# Patient Record
Sex: Female | Born: 1970 | Race: White | Hispanic: No | Marital: Married | Smoking: Never smoker
Health system: Southern US, Community
[De-identification: ages and names within clinical notes are randomized; demographics above are authoritative.]

## PROBLEM LIST (undated history)

## (undated) DIAGNOSIS — Z01419 Encounter for gynecological examination (general) (routine) without abnormal findings: Secondary | ICD-10-CM

## (undated) DIAGNOSIS — Z87440 Personal history of urinary (tract) infections: Secondary | ICD-10-CM

## (undated) DIAGNOSIS — F419 Anxiety disorder, unspecified: Secondary | ICD-10-CM

## (undated) DIAGNOSIS — Z8619 Personal history of other infectious and parasitic diseases: Secondary | ICD-10-CM

## (undated) HISTORY — DX: Personal history of urinary (tract) infections: Z87.440

## (undated) HISTORY — DX: Personal history of other infectious and parasitic diseases: Z86.19

## (undated) HISTORY — DX: Encounter for gynecological examination (general) (routine) without abnormal findings: Z01.419

## (undated) HISTORY — DX: Anxiety disorder, unspecified: F41.9

---

## 1998-01-24 ENCOUNTER — Other Ambulatory Visit: Admission: RE | Admit: 1998-01-24 | Discharge: 1998-01-24 | Payer: Self-pay | Admitting: Obstetrics and Gynecology

## 2003-02-21 ENCOUNTER — Emergency Department (HOSPITAL_COMMUNITY): Admission: EM | Admit: 2003-02-21 | Discharge: 2003-02-21 | Payer: Self-pay | Admitting: Emergency Medicine

## 2003-05-19 ENCOUNTER — Other Ambulatory Visit: Admission: RE | Admit: 2003-05-19 | Discharge: 2003-05-19 | Payer: Self-pay | Admitting: Obstetrics and Gynecology

## 2003-11-17 ENCOUNTER — Inpatient Hospital Stay (HOSPITAL_COMMUNITY): Admission: AD | Admit: 2003-11-17 | Discharge: 2003-11-22 | Payer: Self-pay | Admitting: Obstetrics and Gynecology

## 2003-12-26 ENCOUNTER — Other Ambulatory Visit: Admission: RE | Admit: 2003-12-26 | Discharge: 2003-12-26 | Payer: Self-pay | Admitting: Obstetrics and Gynecology

## 2005-02-07 ENCOUNTER — Other Ambulatory Visit: Admission: RE | Admit: 2005-02-07 | Discharge: 2005-02-07 | Payer: Self-pay | Admitting: Obstetrics and Gynecology

## 2005-09-30 ENCOUNTER — Inpatient Hospital Stay (HOSPITAL_COMMUNITY): Admission: RE | Admit: 2005-09-30 | Discharge: 2005-10-03 | Payer: Self-pay | Admitting: Obstetrics and Gynecology

## 2005-12-12 ENCOUNTER — Other Ambulatory Visit: Admission: RE | Admit: 2005-12-12 | Discharge: 2005-12-12 | Payer: Self-pay | Admitting: Obstetrics and Gynecology

## 2008-02-07 ENCOUNTER — Encounter: Admission: RE | Admit: 2008-02-07 | Discharge: 2008-02-07 | Payer: Self-pay | Admitting: Obstetrics and Gynecology

## 2011-01-17 NOTE — Discharge Summary (Signed)
Chan, Teresa                         ACCOUNT NO.:  [id number]   MEDICAL RECORD NO.:  [id number]                   PATIENT TYPE:  INP   LOCATION:  9110                                 FACILITY:  [facility]   PHYSICIAN:  Michelle L. Vincente Poli, M.D.            DATE OF BIRTH:  04/22/1971   DATE OF ADMISSION:  11/17/2003  DATE OF DISCHARGE:  11/22/2003                                 DISCHARGE SUMMARY   ADMITTING DIAGNOSES:  1. Intrauterine pregnancy at 78 weeks estimated gestational age.  2. Induction of labor secondary to macrosomia.   DISCHARGE DIAGNOSES:  1. Status post low transverse cesarean section secondary to failed induction     and macrosomia.  2. Viable female infant.   PROCEDURE:  Primary low transverse cesarean section.   REASON FOR ADMISSION:  Please see written H&P.   HOSPITAL COURSE:  The patient was a 40 year old primigravida that was  admitted to [facility] at 39 weeks estimated gestational  age for induction of labor due to large-for-gestational-age infant by  ultrasound.  The patient underwent Cytotec cervical ripening with no  noticeable effect.  She underwent one full day of Pitocin induction with  regular strong contractions but no cervical change at the end of the day.  She underwent a second course of cervical ripening with Cytotec, again with  no gain or appreciable effect and the second day of Pitocin achieving  maximal Pitocin but never truly achieving a pattern of labor.  With a  persistently-closed cervix the patient opted for a primary low transverse  cesarean section for a failed induction.  The patient was then transferred  to the operating room where spinal anesthesia was administered without  difficulty.  A low transverse incision was made with the delivery of a  viable female infant weighing 9 pounds 8 ounces with Apgars of 9 at one minute  and 9 at five minutes.  Umbilical cord pH was 7.30.  The patient tolerated  the procedure well  and was taken to the recovery room in stable condition.  On postoperative day #1 vital signs were stable; she was afebrile.  Abdominal was soft with good return of bowel function.  Fundus was firm and  nontender.  Abdominal dressing was clean, dry, and intact.  Labs revealed  hemoglobin of 9.1.  On postoperative day #2 the patient was without  complaint.  Vital signs were stable; she was afebrile.  Abdomen was soft,  fundus was firm and nontender.  Incision was clean, dry, and intact.  Labs  revealed hemoglobin 9.1; platelet count 228,000; wbc count of 10.5.  On  postoperative day #3 vital signs were stable.  The patient was without  complaint.  Abdomen was soft, fundus was firm and nontender.  Incision was  clean, dry, and intact.  Staples were removed and the patient was discharged  home.   CONDITION ON DISCHARGE:  Good.   DIET:  Regular as  tolerated..   ACTIVITY:  No heavy lifting, no driving x2 weeks, no vaginal entry.   FOLLOW-UP:  The patient was to follow up in the office in 1 week for an  incision check.  She is to call for temperature greater than 100 degrees,  persistent nausea and vomiting, heavy vaginal bleeding, and/or redness or  drainage from the incisional site.   DISCHARGE MEDICATIONS:  1. Tylox  #30 one p.o. q.4-6h. p.r.n.  2. Ibuprofen 600 mg q.6h.  3. Prenatal vitamins one p.o. daily.  4. Colace one p.o. daily p.r.n.     [name], N.P.                        [name], M.D.    CC/MEDQ  D:  12/11/2003  T:  12/11/2003  Job:  [id number]

## 2011-01-17 NOTE — H&P (Signed)
NAMEKEARSTON, PUTMAN             ACCOUNT NO.:  [id number]   MEDICAL RECORD NO.:  [id number]          PATIENT TYPE:  INP   LOCATION:  NA                            [facility]  [facility]   PHYSICIAN:  Dineen Kid. Rana Snare, M.D.    DATE OF BIRTH:  February 01, 1971   DATE OF ADMISSION:  DATE OF DISCHARGE:                                HISTORY & PHYSICAL   HISTORY OF PRESENT ILLNESS:  Ms. Demauro is a 40 year old G3, P1, A1 at [redacted]  weeks gestational age who presents for repeat Cesarean section.  Her primary  Cesarean section was in 2005 for failed induction and macrosomia.  This  pregnancy has been unremarkable.  She does desire repeat Cesarean section.  Her estimated date of confinement is October 07, 2005.  She is group B Strep  negative.  She had normal ultrasound evaluation and quad screen.   PAST MEDICAL HISTORY:  Unremarkable.  She has had a colposcopy and Cesarean  section.   PHYSICAL EXAMINATION:  VITAL SIGNS:  Blood pressure is 108/70.  HEART:  Regular rate and rhythm.  LUNGS:  Clear to auscultation bilaterally.  ABDOMEN:  Gravid, nontender.  PELVIC:  Cervix is closed, thick and high.   IMPRESSION:  Intrauterine pregnancy at 39 weeks with previous Cesarean  section desiring repeat.   PLAN:  Repeat low transverse Cesarean section.  The risks and benefits were  discussed at length and informed consent was obtained.      Dineen Kid Rana Snare, M.D.  Electronically Signed     DCL/MEDQ  D:  09/30/2005  T:  09/30/2005  Job:  [id number]

## 2011-01-17 NOTE — Discharge Summary (Signed)
NAMEZULEIKA, Teresa Chan             ACCOUNT NO.:  [id number]   MEDICAL RECORD NO.:  [id number]          PATIENT TYPE:  INP   LOCATION:  9146                          [facility]   PHYSICIAN:  Zelphia Cairo, MD    DATE OF BIRTH:  [birth year]   DATE OF ADMISSION:  09/30/2005  DATE OF DISCHARGE:  10/03/2005                                 DISCHARGE SUMMARY   ADMISSION DIAGNOSES:  1.  Intrauterine pregnancy at 52 weeks' estimated gestational age.  2.  Previous cesarean section, desires repeat.   DISCHARGE DIAGNOSES:  1.  Status post low transverse cesarean section.  2.  A viable female infant.   PROCEDURE:  Repeat low transverse cesarean section.   REASON FOR ADMISSION:  Please see written H&P.   HOSPITAL COURSE:  The patient is a 40 year old gravida 2, para 1, that was  admitted to [facility] at 70 weeks' estimated gestational  age for a scheduled cesarean section.  The patient had a history of  macrosomia and had a previous cesarean delivery.  The patient desired  cesarean delivery.  On the morning of admission the patient was taken to the  operating room, where spinal anesthesia was administered without difficulty.  A low transverse incision was made with delivery of a viable female infant  weighing 8 pounds 11 ounces, Apgars of 9 at one minute and 9 at five  minutes.  The patient tolerated the procedure well and was taken to the  recovery room in stable condition.  On postoperative day #1, the patient was  without complaint.  Vital signs were stable.  She was afebrile.  Abdomen  soft.  Fundus firm and nontender.  Abdominal dressing was noted to be clean,  dry and intact.  Laboratory findings did reveal low hemoglobin of 7.4.  On  postoperative day #2, the patient was without complaint.  Vital signs were  stable.  She was afebrile.  Abdomen was soft with good return of bowel function.  Fundus firm and nontender.  Incision was clean, dry and intact.  The patient  was started on iron supplementation.  On postoperative day #3, the patient  was without complaint.  Vital signs were stable.  Fundus firm and nontender.  Incision was clean, dry and intact with some mild ecchymosis noted around  the incisional site.  Discharge instructions reviewed and the patient was  later discharged home.   CONDITION ON DISCHARGE:  Good.   DIET:  Regular as tolerated.   ACTIVITY:  No heavy lifting, no driving x2 weeks, no vaginal entry.   FOLLOWUP:  Patient to follow up in the office in one week for an incision  check.  She is to call for temperature greater than 100 degrees, persistent  nausea or vomiting, heavy vaginal bleeding and/or redness or drainage from  the incisional site.   DISCHARGE MEDICATIONS:  1.  Tylox #30, one p.o. q.4-6h. p.r.n.  2.  Motrin 600 mg every six hours.  3.  Prenatal vitamins one p.o. daily.  4.  Ferrous sulfate 325 mg one p.o. daily.      [name], N.P.  Zelphia Cairo, MD  Electronically Signed    CC/MEDQ  D:  10/17/2005  T:  10/17/2005  Job:  [id number]

## 2011-01-17 NOTE — Op Note (Signed)
Teresa Chan, Teresa Chan                         ACCOUNT NO.:  [id number]   MEDICAL RECORD NO.:  [id number]                   PATIENT TYPE:  INP   LOCATION:  9110                                 [facility]ACILITY[facility]   PHYSICIAN:  Dineen Kid. Rana Snare, M.D.                 DATE OF BIRTH:  [birth year]   DATE OF PROCEDURE:  11/19/2003  DATE OF DISCHARGE:                                 OPERATIVE REPORT   PREOPERATIVE DIAGNOSIS:  Intrauterine pregnancy at 39 weeks, macrosomia, and  failed induction.   POSTOPERATIVE DIAGNOSIS:  Intrauterine pregnancy at 39 weeks, macrosomia,  and failed induction.   PROCEDURE:  Primary low segment transverse cesarean section.   SURGEON:  Dineen Kid. Rana Snare, M.D.   ANESTHESIA:  Spinal.   INDICATIONS FOR PROCEDURE:  Ms. Durkin is a 40 year old G1 who presented  at 76 weeks for induction of labor due to large for gestational age infant  by ultrasound.  She underwent Cytotec cervical ripening with no noticeable  affect.  She underwent one full day of Pitocin induction with regular strong  contractions but no cervical change at the end of the day.  She underwent a  second course of cervical ripening with Cytotec with, again, no appreciable  affect and a second day of Pitocin achieving maximum Pitocin but never truly  achieving labor.  With a persistently closed cervix, the patient then opted  for primary low segment transverse cesarean section for failed induction.  The risks and benefits were discussed, informed consent was obtained.   OPERATIVE FINDINGS:  A viable female infant, Apgars 9 and 9, weight 9 pounds 8  ounces, pH arterial 7.3.   DESCRIPTION OF PROCEDURE:  After adequate analgesia, the patient was placed  in the supine position left lateral tilt.  She was sterilely prepped and  draped.  The Foley catheter was sterilely placed.  A Pfannenstiel skin  incision was made 2 fingerbreadths above the pubic symphysis taking it  sharply to the fascia.  This was incised  transversely, extended superiorly  and inferiorly to the rectus muscle which was separated sharply in the  midline.  The peritoneum was entered sharply, the bladder blade was created  and placed behind the bladder blade.  A low segment myotomy incision was  made down to the infant's vertex, extended laterally with the operators  fingertips.  The infant's vertex was then delivered with the vacuum  extractor.  The nares were suctioned.  The infant's shoulders were then  delivered and the remainder of the fetus.  The cord was clamped, cut, and  the infant was handed to the pediatrician for resuscitation.  Good cry was  noted.  The placenta was extracted manually.  The uterus was exteriorized,  wiped clean of a dry lap, and the myotomy incision was closed in two layers,  the first being running locking layer, the second being an imbricated layer  of 0 Monocryl suture.  Irrigation was applied.  The uterus was placed back  into the peritoneal cavity.  After a copious amount of irrigation, adequate  hemostasis was assured.  The peritoneum was closed with 0 Monocryl in a  running fashion, the rectus muscles were plicated.  Irrigation was applied  and after adequate hemostasis, the fascia was closed with a #1 Vicryl in a  running fashion.  Irrigation was  once again applied and after hemostasis, the skin was stapled and Steri-  Strips were applied.  The patient tolerated the procedure well, was stable  on transfer to the recovery room.  Sponge and instrument counts were correct  x 3.  Estimated blood loss was 700 mL.  The patient received 1 gram of  Cefotetan after delivery of the placenta.                                               Dineen Kid Rana Snare, M.D.    DCL/MEDQ  D:  11/19/2003  T:  11/20/2003  Job:  [id number]

## 2011-01-17 NOTE — Op Note (Signed)
Teresa Chan, Teresa Chan             ACCOUNT NO.:  [id number]   MEDICAL RECORD NO.:  [id number]          PATIENT TYPE:  INP   LOCATION:  9146                          [facility]   PHYSICIAN:  Dineen Kid. Rana Snare, M.D.    DATE OF BIRTH:  06-Dec-1970   DATE OF PROCEDURE:  09/30/2005  DATE OF DISCHARGE:                                 OPERATIVE REPORT   PREOPERATIVE DIAGNOSES:  1.  Previous cesarean section with desired repeat.  2.  Intrauterine pregnancy at 39 weeks.   POSTOPERATIVE DIAGNOSES:  1.  Previous cesarean section with desired repeat.  2.  Intrauterine pregnancy at 39 weeks.   PROCEDURE:  Repeat low segment transverse cesarean section.   SURGEON:  Dineen Kid. Rana Snare, M.D.   ANESTHESIA:  Spinal.   INDICATIONS:  [name] is a 40 year old G2, P73, at 63 weeks' gestational  age, with an uncomplicated pregnancy.  She does have a history of macrosomia  and previous cesarean section, and she desires repeat cesarean section and  presents for this today.  Risks and benefits were discussed, informed  consent was obtained.   FINDINGS AT THE TIME OF SURGERY:  A viable female infant, Apgars 9 and 9,  weight 8 pounds 11 ounces.   DESCRIPTION OF PROCEDURE:  After adequate analgesia, the patient placed in  the supine position, left lateral tilt.  She was sterilely prepped and  draped, the bladder was sterilely drained with a Foley catheter.  The  previous Pfannenstiel skin incision was sharply excised.  The incision was  taken down sharply to the fascia, which was incised transversely, extended  superiorly and inferiorly off the bellies of the rectus muscle, which were  separated sharply in the midline.  The peritoneum was entered sharply.  The  bladder flap was created and placed behind the bladder blade.  A low segment  myotomy incision was made down to the infant's vertex, extended laterally  with the operator's fingertips.  The infant's vertex was delivered using  traction from a  vacuum extractor, the nares and pharynx suctioned.  The  infant was then delivered, cord clamped and cut, and handed to the  pediatricians for resuscitation.  Cord blood was obtained, the placenta  extracted manually.  The uterus was exteriorized, wiped clean with a dry  lap.  The myotomy incision closed in two layers, the first being a running  locking layer, the second being an imbricating layer of 0 Monocryl suture.  The uterus was placed back in the peritoneal cavity and after a copious  amount of irrigation, adequate hemostasis was assured.  The peritoneum was  closed with 0 Monocryl, rectus muscles plicated in the midline.  Irrigation  was applied and after adequate hemostasis, the fascia was closed with a #1  Vicryl in a running fashion.  After adequate hemostasis was assured, the  skin was stapled and Steri-Strips  applied.  The patient tolerated the procedure well, was stable on transfer  to the recovery room.  The sponge, needle and instrument count was normal  x3.  Estimated blood loss was 700 mL.  The patient did receive  1 g of  Rocephin after delivery of the placenta.      Dineen Kid Rana Snare, M.D.  Electronically Signed     DCL/MEDQ  D:  09/30/2005  T:  09/30/2005  Job:  [id number]

## 2013-01-21 ENCOUNTER — Other Ambulatory Visit: Payer: Self-pay

## 2013-01-21 DIAGNOSIS — Z1231 Encounter for screening mammogram for malignant neoplasm of breast: Secondary | ICD-10-CM

## 2013-01-25 ENCOUNTER — Other Ambulatory Visit: Payer: Self-pay | Admitting: Obstetrics and Gynecology

## 2013-01-25 DIAGNOSIS — R928 Other abnormal and inconclusive findings on diagnostic imaging of breast: Secondary | ICD-10-CM

## 2013-02-01 ENCOUNTER — Ambulatory Visit
Admission: RE | Admit: 2013-02-01 | Discharge: 2013-02-01 | Disposition: A | Discharge status: Home / Self Care | Payer: BC Managed Care – PPO | Source: Ambulatory Visit

## 2013-02-01 DIAGNOSIS — R928 Other abnormal and inconclusive findings on diagnostic imaging of breast: Secondary | ICD-10-CM

## 2013-02-01 DIAGNOSIS — Z1231 Encounter for screening mammogram for malignant neoplasm of breast: Secondary | ICD-10-CM

## 2013-02-11 ENCOUNTER — Ambulatory Visit (INDEPENDENT_AMBULATORY_CARE_PROVIDER_SITE_OTHER): Payer: Self-pay | Admitting: General Surgery

## 2013-02-15 ENCOUNTER — Encounter (INDEPENDENT_AMBULATORY_CARE_PROVIDER_SITE_OTHER): Payer: Self-pay | Admitting: General Surgery

## 2013-02-15 ENCOUNTER — Ambulatory Visit (INDEPENDENT_AMBULATORY_CARE_PROVIDER_SITE_OTHER): Payer: BC Managed Care – PPO | Admitting: General Surgery

## 2013-02-15 ENCOUNTER — Other Ambulatory Visit (INDEPENDENT_AMBULATORY_CARE_PROVIDER_SITE_OTHER): Payer: Self-pay | Admitting: General Surgery

## 2013-02-15 VITALS — BP 118/72 | HR 78 | Resp 16 | Ht 64.0 in | Wt 186.6 lb

## 2013-02-15 DIAGNOSIS — N6310 Unspecified lump in the right breast, unspecified quadrant: Secondary | ICD-10-CM

## 2013-02-15 DIAGNOSIS — R928 Other abnormal and inconclusive findings on diagnostic imaging of breast: Secondary | ICD-10-CM

## 2013-02-16 NOTE — Progress Notes (Signed)
Patient ID: Teresa Chan, female   DOB: Dec 17, 1970, 42 y.o.   MRN: [id number]  Chief Complaint  Patient presents with  . New Evaluation    eval rt br papilloma    HPI Teresa Chan is a 42 y.o. female.  Referred by Dr Judyann Munson HPI 42 yo otherwise healthy female who underwent initial mm at 3 and then second at age 74 recently.  She has no real complaints referable to either breast except for some longstanding accessory breast tissue in both axillae that bother her a lot. She underwent mm and [place] below with finding of possible right breast papilloma and then was referred for consideration of surgery.  History reviewed. No pertinent past medical history.  Past Surgical History  Procedure Laterality Date  . Cesarean section      History reviewed. No pertinent family history.  Social History History  Substance Use Topics  . Smoking status: Never Smoker   . Smokeless tobacco: Never Used  . Alcohol Use: No    No Known Allergies  No current outpatient prescriptions on file.   No current facility-administered medications for this visit.    Review of Systems Review of Systems  Constitutional: Negative for fever, chills and unexpected weight change.  HENT: Negative for hearing loss, congestion, sore throat, trouble swallowing and voice change.   Eyes: Negative for visual disturbance.  Respiratory: Negative for cough and wheezing.   Cardiovascular: Negative for chest pain, palpitations and leg swelling.  Gastrointestinal: Negative for nausea, vomiting, abdominal pain, diarrhea, constipation, blood in stool, abdominal distention and anal bleeding.  Genitourinary: Negative for hematuria, vaginal bleeding and difficulty urinating.  Musculoskeletal: Negative for arthralgias.  Skin: Negative for rash and wound.  Neurological: Negative for seizures, syncope and headaches.  Hematological: Negative for adenopathy. Does not bruise/bleed easily.  Psychiatric/Behavioral: Negative for  confusion.    Blood pressure 118/72, pulse 78, resp. rate 16, height 5\' 4"  (1.626 m), weight 186 lb 9.6 oz (84.641 kg), last menstrual period 01/20/2013.  Physical Exam Physical Exam  Vitals reviewed. Constitutional: She appears well-developed and well-nourished.  Neck: Neck supple.  Cardiovascular: Normal rate, regular rhythm and normal heart sounds.   Pulmonary/Chest: Effort normal and breath sounds normal. She has no wheezes. She has no rales. Right breast exhibits no inverted nipple, no mass, no nipple discharge, no skin change and no tenderness. Left breast exhibits no inverted nipple, no mass, no nipple discharge, no skin change and no tenderness.    Lymphadenopathy:    She has no cervical adenopathy.    She has no axillary adenopathy.       Right: No supraclavicular adenopathy present.       Left: No supraclavicular adenopathy present.    Data Reviewed DIGITAL DIAGNOSTIC RIGHT MAMMOGRAM AND RIGHT BREAST ULTRASOUND:  Comparison: With priors  Findings:  ACR Breast Density Category 3: The breast tissue is heterogeneously  dense.  Spot compression views of the lower inner subareolar region of the  right breast was performed. There is persistence of an obscured 1  cm nodular density. There are no associated malignant-type  microcalcifications.  On physical exam, I do not palpate a mass in the right breast. No  nipple discharge could be expressed.  Ultrasound is performed, showing there is a dilated duct and in the  duct is a 5 mm hypoechoic nodule. This is concerning for an  intraductal papilloma.  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  RECOMMENDATION:  Surgical consultation has been scheduled with  Dr. Dwain Sarna on  02/11/2013.   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  I think proceeding with radiologic core biopsy first would be best option. I have discussed this with Dr Jean Rosenthal and will have  that performed and then follow up.    I also discussed with her at some point depending on symptoms excision of accessory breast tissue.     Ubah Radke 02/16/2013, 11:29 AM

## 2013-02-22 ENCOUNTER — Ambulatory Visit
Admission: RE | Admit: 2013-02-22 | Discharge: 2013-02-22 | Disposition: A | Discharge status: Home / Self Care | Payer: BC Managed Care – PPO | Source: Ambulatory Visit | Attending: General Surgery | Admitting: General Surgery

## 2013-02-22 ENCOUNTER — Other Ambulatory Visit (INDEPENDENT_AMBULATORY_CARE_PROVIDER_SITE_OTHER): Payer: Self-pay | Admitting: General Surgery

## 2013-02-22 ENCOUNTER — Other Ambulatory Visit: Payer: BC Managed Care – PPO

## 2013-02-22 DIAGNOSIS — N6310 Unspecified lump in the right breast, unspecified quadrant: Secondary | ICD-10-CM

## 2014-02-06 LAB — HM MAMMOGRAPHY: HM MAMMO: NEGATIVE

## 2014-06-13 LAB — HM PAP SMEAR: HM Pap smear: NEGATIVE

## 2014-09-25 ENCOUNTER — Telehealth: Payer: Self-pay | Admitting: Family Medicine

## 2014-09-25 NOTE — Telephone Encounter (Signed)
Called patient via website message inquiry. She wants to cancel Wednesday appt with dr Tamala Julian. She states that she does not wish to reschedule.

## 2014-09-27 ENCOUNTER — Ambulatory Visit: Payer: Self-pay | Admitting: Family Medicine

## 2014-12-13 ENCOUNTER — Ambulatory Visit (INDEPENDENT_AMBULATORY_CARE_PROVIDER_SITE_OTHER): Payer: BLUE CROSS/BLUE SHIELD | Admitting: Medical

## 2014-12-13 ENCOUNTER — Encounter: Payer: Self-pay | Admitting: Medical

## 2014-12-13 VITALS — BP 110/80 | HR 75 | Resp 15 | Wt 186.0 lb

## 2014-12-13 DIAGNOSIS — Z87440 Personal history of urinary (tract) infections: Secondary | ICD-10-CM | POA: Diagnosis not present

## 2014-12-13 DIAGNOSIS — E669 Obesity, unspecified: Secondary | ICD-10-CM

## 2014-12-13 DIAGNOSIS — Z7189 Other specified counseling: Secondary | ICD-10-CM | POA: Diagnosis not present

## 2014-12-13 NOTE — Progress Notes (Signed)
Subjective: Here as a new patient today mainly to establish care.   Doing fine in normal state of health.   Hasn't had a primary care provider.   Sees gyn yearly, hx/o abnormal mammogram and abnormal pap. otherwise exercises, tries to eat healthy, no major issues, no major primary healthy problems.  Gets UTIs from time time, most of the time without symptoms caught on yearly physical.  No other issues.  Past Medical History  Diagnosis Date  . History of frequent urinary tract infections   . Encounter for routine gynecological examination     Dr. Corinna Capra    Past Surgical History  Procedure Laterality Date  . Cesarean section      x2    History   Social History  . Marital Status: Married    Spouse Name: N/A  . Number of Children: N/A  . Years of Education: N/A   Occupational History  . Not on file.   Social History Main Topics  . Smoking status: Never Smoker   . Smokeless tobacco: Never Used  . Alcohol Use: 0.6 oz/week    1 Cans of beer per week  . Drug Use: No  . Sexual Activity: Not on file   Other Topics Concern  . Not on file   Social History Narrative   Married, 2 children, 9yo and 42yo.   Homemaker.  Sews, quilts, bike.     Family History  Problem Relation Age of Onset  . Kidney nephrosis Mother   . Diabetes Father   . Cancer Father     skin  . Cancer Paternal Grandmother     skin  . Diabetes Paternal Grandfather   . Hypertension Neg Hx   . Heart disease Maternal Grandmother 80  . Stroke Maternal Grandmother 80    No current outpatient prescriptions on file.  No Known Allergies   ROS as in subjective  Objective: BP 110/80 mmHg  Pulse 75  Resp 15  Wt 186 lb (84.369 kg)  General appearance: alert, no distress, WD/WN, white female Neck: supple, no lymphadenopathy, no thyromegaly, no masses Heart: RRR, normal S1, S2, no murmurs Lungs: CTA bilaterally, no wheezes, rhonchi, or rales Abdomen: +bs, soft, non tender, non distended, no masses, no  hepatomegaly, no splenomegaly Pulses: 2+ symmetric, upper and lower extremities, normal cap refill    Assessment: Encounter Diagnoses  Name Primary?  . History of urinary tract infection Yes  . Obesity   . Counseling on health promotion and disease prevention      Plan: Counselor on purpose of primary care, discussed routine screening, vaccines, healthy lifestyle. discussed efforts at weight loss, diet, and if desired and return to do clean catch UA.  She is on her period today.   Signed release to get recent gynecology physical and lab panel that was done in fal 2015.  F/u for physical at her convenience.

## 2014-12-14 ENCOUNTER — Encounter: Payer: Self-pay | Admitting: Internal Medicine

## 2014-12-18 ENCOUNTER — Encounter: Payer: Self-pay | Admitting: Medical

## 2015-02-05 ENCOUNTER — Other Ambulatory Visit: Payer: Self-pay | Admitting: Obstetrics and Gynecology

## 2015-02-05 DIAGNOSIS — R928 Other abnormal and inconclusive findings on diagnostic imaging of breast: Secondary | ICD-10-CM

## 2015-02-08 ENCOUNTER — Ambulatory Visit
Admission: RE | Admit: 2015-02-08 | Discharge: 2015-02-08 | Disposition: A | Discharge status: Home / Self Care | Payer: BLUE CROSS/BLUE SHIELD | Source: Ambulatory Visit | Attending: Obstetrics and Gynecology | Admitting: Obstetrics and Gynecology

## 2015-02-08 DIAGNOSIS — R928 Other abnormal and inconclusive findings on diagnostic imaging of breast: Secondary | ICD-10-CM

## 2016-07-10 ENCOUNTER — Telehealth: Payer: Self-pay | Admitting: Internal Medicine

## 2016-07-10 NOTE — Telephone Encounter (Signed)
Pt's husband sees Dr. Raliegh Ip, and his wife is in need of a PCP, so he was wondering if she could establish care with him as well. Please advise

## 2016-07-10 NOTE — Telephone Encounter (Signed)
Please notify that I'm no longer taking any new patients

## 2016-07-10 NOTE — Telephone Encounter (Signed)
See message and advise

## 2017-07-09 LAB — HM MAMMOGRAPHY

## 2017-07-13 LAB — HM PAP SMEAR

## 2018-04-14 ENCOUNTER — Encounter: Payer: Self-pay | Admitting: Family Medicine

## 2018-04-14 ENCOUNTER — Ambulatory Visit: Payer: BC Managed Care – PPO | Admitting: Family Medicine

## 2018-04-14 VITALS — BP 130/90 | HR 73 | Temp 98.3°F | Ht 64.0 in | Wt 180.4 lb

## 2018-04-14 DIAGNOSIS — Z0000 Encounter for general adult medical examination without abnormal findings: Secondary | ICD-10-CM

## 2018-04-14 DIAGNOSIS — Z114 Encounter for screening for human immunodeficiency virus [HIV]: Secondary | ICD-10-CM | POA: Diagnosis not present

## 2018-04-14 DIAGNOSIS — R5383 Other fatigue: Secondary | ICD-10-CM

## 2018-04-14 DIAGNOSIS — F419 Anxiety disorder, unspecified: Secondary | ICD-10-CM | POA: Diagnosis not present

## 2018-04-14 NOTE — Patient Instructions (Addendum)
Can look into buspar (buspirone) for anxiety. Can take as needed. Let me know if you feel like you need this.   Come back in for labs. Watch your lower number (diastolic) on your blood pressure. Want under 90.Marland KitchenMarland Kitchen

## 2018-04-14 NOTE — Progress Notes (Addendum)
Patient: Teresa Chan MRN: [id number] DOB: 1971-05-09 PCP: Orma Flaming, MD     Subjective:  Chief Complaint  Patient presents with  . Establish Care    cpe    HPI: The patient is a 47 y.o. female who presents today for annual exam. She denies any changes to past medical history. There have been no recent hospitalizations. They are following a well balanced diet and exercise plan. Weight has been stable. No complaints today. History of thyroid issues in both her mother and sister.   Anxiety: She states she doesn't feel like she needs medication, it is just a really stressful time for her at work. She has 2 extremely stressful months and then is fine. She denies any panic attacks. No hx of anxiety in family. Never had an issue or been on medication in the past. Not really interested in medication today. Does not exercise since going back to work. She does get good sleep. Does complain of some fatigue.  There is no immunization history on file for this patient.  Mammogram: 07/2018 Pap smear: 07/2018. Hx of abnormal pap. No procedures. Followed by dr. Corinna Capra.  Tdap: unsure  hiv will order.   Review of Systems  Constitutional: Positive for fatigue. Negative for chills and fever.  HENT: Negative for dental problem, ear pain, hearing loss and trouble swallowing.   Eyes: Negative for visual disturbance.  Respiratory: Negative for cough, chest tightness and shortness of breath.   Cardiovascular: Negative for chest pain, palpitations and leg swelling.  Gastrointestinal: Negative for abdominal pain, blood in stool, diarrhea and nausea.  Endocrine: Negative for cold intolerance, polydipsia, polyphagia and polyuria.  Genitourinary: Negative for dysuria and hematuria.  Musculoskeletal: Positive for back pain. Negative for arthralgias and neck pain.  Skin: Negative for rash.  Neurological: Negative for dizziness and headaches.  Psychiatric/Behavioral: Negative for dysphoric mood and sleep  disturbance. The patient is nervous/anxious.     Allergies Patient has No Known Allergies.  Past Medical History Patient  has a past medical history of Anxiety, Encounter for routine gynecological examination, History of chicken pox, History of frequent urinary tract infections, and History of UTI.  Surgical History Patient  has a past surgical history that includes Cesarean section (2005, 2007).  Family History Pateint's family history includes Cancer in her father and paternal grandmother; Depression in her paternal grandfather; Diabetes in her father and paternal grandfather; Heart disease (age of onset: 4) in her maternal grandmother; Hyperlipidemia in her father and mother; Hypertension in her father and mother; Kidney nephrosis in her mother; Learning disabilities in her father; Stroke (age of onset: 45) in her maternal grandmother.  Social History Patient  reports that she has never smoked. She has never used smokeless tobacco. She reports that she drinks about 1.0 standard drinks of alcohol per week. She reports that she does not use drugs.    Objective: Vitals:   04/14/18 1011 04/14/18 1035  BP: (!) 136/94 130/90  Pulse: 73   Temp: 98.3 F (36.8 C)   TempSrc: Oral   SpO2: 99%   Weight: 180 lb 6.4 oz (81.8 kg)   Height: 5\' 4"  (1.626 m)     Body mass index is 30.97 kg/m.  Physical Exam  Constitutional: She is oriented to person, place, and time. She appears well-developed and well-nourished.  HENT:  Right Ear: External ear normal.  Left Ear: External ear normal.  Mouth/Throat: Oropharynx is clear and moist.  Eyes: Pupils are equal, round, and reactive to light. Conjunctivae  and EOM are normal.  Neck: Normal range of motion. Neck supple. No thyromegaly present.  Cardiovascular: Normal rate, regular rhythm, normal heart sounds and intact distal pulses.  No murmur heard. Pulmonary/Chest: Effort normal and breath sounds normal.  Abdominal: Soft. Bowel sounds are  normal. She exhibits no distension. There is no tenderness.  Lymphadenopathy:    She has no cervical adenopathy.  Neurological: She is alert and oriented to person, place, and time. She displays normal reflexes. No cranial nerve deficit. Coordination normal.  No si/hi   Skin: Skin is warm and dry. No rash noted.  Psychiatric: She has a normal mood and affect. Her behavior is normal.  Vitals reviewed.       GAD 7 : Generalized Anxiety Score 04/14/2018  Nervous, Anxious, on Edge 3  Control/stop worrying 2  Worry too much - different things 3  Trouble relaxing 2  Restless 0  Easily annoyed or irritable 1  Afraid - awful might happen 2  Total GAD 7 Score 13  Anxiety Difficulty Not difficult at all       Assessment/plan: 1. Annual physical exam Will come back for routine fasting labs. Needs thyroid yearly with strong family history. Also checking vitamin D with fatigue. Gyn screening followed by dr. Corinna Capra. Sees dentist and derm regularly. Following good sun protection. Patient counseling [x]    Nutrition: Stressed importance of moderation in sodium/caffeine intake, saturated fat and cholesterol, caloric balance, sufficient intake of fresh fruits, vegetables, fiber, calcium, iron, and 1 mg of folate supplement per day (for females capable of pregnancy).  [x]    Stressed the importance of regular exercise.   []    Substance Abuse: Discussed cessation/primary prevention of tobacco, alcohol, or other drug use; driving or other dangerous activities under the influence; availability of treatment for abuse.   [x]    Injury prevention: Discussed safety belts, safety helmets, smoke detector, smoking near bedding or upholstery.   [x]    Sexuality: Discussed sexually transmitted diseases, partner selection, use of condoms, avoidance of unintended pregnancy  and contraceptive alternatives.  [x]    Dental health: Discussed importance of regular tooth brushing, flossing, and dental visits.  [x]    Health  maintenance and immunizations reviewed. Please refer to Health maintenance section.    - Comprehensive metabolic panel; Future - CBC with Differential/Platelet; Future - Lipid panel; Future - TSH; Future  2. Other fatigue Routine lab work. Anxiety likely contributing and going back to work. Did recommend routine exercise plan to help and checking D.  - VITAMIN D 25 Hydroxy (Vit-D Deficiency, Fractures); Future  3. Encounter for screening for HIV  - HIV antibody; Future  4. Anxiety Situational. Not interested in medication at this time. GAd7 score with moderate anxiety.  Only 2 months out of the year. She would do well with buspar PRN during this time. Information on drug given and she is going to go home and look this over and will let me know if she is interested. Exercise, sleep, yoga would help as well. F/u as needed and can let me know if she wants to start buspar prn.   5. Elevated blood pressure without diagnosis of HTN -diastolic is borderline on repeat. Will have her watch at home and with gyn in November. If consistently elevated above 90, will need to follow up for medication. Discussed low salt diet, exercise and weight loss.      Return in about 1 year (around 04/15/2019).     Orma Flaming, MD [place]  04/14/2018

## 2018-12-27 ENCOUNTER — Ambulatory Visit (INDEPENDENT_AMBULATORY_CARE_PROVIDER_SITE_OTHER): Payer: BC Managed Care – PPO | Admitting: Family Medicine

## 2018-12-27 ENCOUNTER — Encounter: Payer: Self-pay | Admitting: Family Medicine

## 2018-12-27 ENCOUNTER — Ambulatory Visit: Payer: Self-pay | Admitting: *Deleted

## 2018-12-27 VITALS — BP 112/82 | HR 81 | Ht 64.0 in | Wt 172.0 lb

## 2018-12-27 DIAGNOSIS — R000 Tachycardia, unspecified: Secondary | ICD-10-CM | POA: Diagnosis not present

## 2018-12-27 DIAGNOSIS — Z0000 Encounter for general adult medical examination without abnormal findings: Secondary | ICD-10-CM

## 2018-12-27 DIAGNOSIS — R202 Paresthesia of skin: Secondary | ICD-10-CM

## 2018-12-27 LAB — CBC WITH DIFFERENTIAL/PLATELET
Basophils Absolute: 0.1 10*3/uL (ref 0.0–0.1)
Basophils Relative: 0.9 % (ref 0.0–3.0)
Eosinophils Absolute: 0 10*3/uL (ref 0.0–0.7)
Eosinophils Relative: 0.3 % (ref 0.0–5.0)
HCT: 37.9 % (ref 36.0–46.0)
Hemoglobin: 12.1 g/dL (ref 12.0–15.0)
Lymphocytes Relative: 21.2 % (ref 12.0–46.0)
Lymphs Abs: 1.3 10*3/uL (ref 0.7–4.0)
MCHC: 31.9 g/dL (ref 30.0–36.0)
MCV: 75.8 fl — ABNORMAL LOW (ref 78.0–100.0)
Monocytes Absolute: 0.4 10*3/uL (ref 0.1–1.0)
Monocytes Relative: 7.1 % (ref 3.0–12.0)
Neutro Abs: 4.2 10*3/uL (ref 1.4–7.7)
Neutrophils Relative %: 70.5 % (ref 43.0–77.0)
Platelets: 336 10*3/uL (ref 150.0–400.0)
RBC: 5 Mil/uL (ref 3.87–5.11)
RDW: 15.8 % — ABNORMAL HIGH (ref 11.5–15.5)
WBC: 6 10*3/uL (ref 4.0–10.5)

## 2018-12-27 LAB — COMPREHENSIVE METABOLIC PANEL
ALT: 10 U/L (ref 0–35)
AST: 14 U/L (ref 0–37)
Albumin: 4.9 g/dL (ref 3.5–5.2)
Alkaline Phosphatase: 39 U/L (ref 39–117)
BUN: 13 mg/dL (ref 6–23)
CO2: 26 mEq/L (ref 19–32)
Calcium: 10.3 mg/dL (ref 8.4–10.5)
Chloride: 105 mEq/L (ref 96–112)
Creatinine, Ser: 0.79 mg/dL (ref 0.40–1.20)
GFR: 78.37 mL/min (ref >60.00)
Glucose, Bld: 87 mg/dL (ref 70–99)
Potassium: 4.1 mEq/L (ref 3.5–5.1)
Sodium: 140 mEq/L (ref 135–145)
Total Bilirubin: 0.5 mg/dL (ref 0.2–1.2)
Total Protein: 7.2 g/dL (ref 6.0–8.3)

## 2018-12-27 LAB — LIPID PANEL
Cholesterol: 129 mg/dL (ref 0–200)
HDL: 52.8 mg/dL (ref >39.00)
LDL Cholesterol: 66 mg/dL (ref 0–99)
NonHDL: 76.59
Total CHOL/HDL Ratio: 2
Triglycerides: 52 mg/dL (ref 0.0–149.0)
VLDL: 10.4 mg/dL (ref 0.0–40.0)

## 2018-12-27 MED ORDER — HYDROXYZINE HCL 25 MG PO TABS: ORAL_TABLET | ORAL | 1 refills | Status: DC

## 2018-12-27 NOTE — Telephone Encounter (Signed)
Experiencing lightheadedness, no vertigo, with occasional left arm tingling. On Saturday, her left leg also felt tingling at the same time and both last all day. Headaches daily, at times her vision feels slightly blurry. Heart also feels like its racing at times, heart rate now is 80 bpm. Resolves on its own.diarrhea for one week no blood or mucus noted. No difficulty voiding. No CP/SOB/fever.No travels and no known exposures.Working from home. smartphone [phone].routing to PCP for appointment. Reason for Disposition . [1] MODERATE dizziness (e.g., interferes with normal activities) AND [2] has NOT been evaluated by physician for this  (Exception: dizziness caused by heat exposure, sudden standing, or poor fluid intake)  Answer Assessment - Initial Assessment Questions 1. DESCRIPTION: "Describe your dizziness."     General feeling, sometimes after a walk. 2. LIGHTHEADED: "Do you feel lightheaded?" (e.g., somewhat faint, woozy, weak upon standing)     Woozy in the head. 3. VERTIGO: "Do you feel like either you or the room is spinning or tilting?" (i.e. vertigo)     no 4. SEVERITY: "How bad is it?"  "Do you feel like you are going to faint?" "Can you stand and walk?"   - MILD - walking normally   - MODERATE - interferes with normal activities (e.g., work, school)    - SEVERE - unable to stand, requires support to walk, feels like passing out now.      mild 5. ONSET:  "When did the dizziness begin?"     Last Tuesday 6. AGGRAVATING FACTORS: "Does anything make it worse?" (e.g., standing, change in head position)    Nothing. Feels this way including with sitting and lying down. 7. HEART RATE: "Can you tell me your heart rate?" "How many beats in 15 seconds?"  (Note: not all patients can do this)       80 bpm. 8. CAUSE: "What do you think is causing the dizziness?"    Unsure maybe anxiety 9. RECURRENT SYMPTOM: "Have you had dizziness before?" If so, ask: "When was the last time?" "What  happened that time?"     no 10. OTHER SYMPTOMS: "Do you have any other symptoms?" (e.g., fever, chest pain, vomiting, diarrhea, bleeding)       Heart feels racing more than once daily over the last week. 11. PREGNANCY: "Is there any chance you are pregnant?" "When was your last menstrual period?"       no  Protocols used: DIZZINESS [facility]

## 2018-12-27 NOTE — Progress Notes (Deleted)
Patient: Teresa Chan MRN: [id number] DOB: Aug 31, 1971 PCP: Orma Flaming, MD     Subjective:  No chief complaint on file.   HPI: The patient is a 48 y.o. female who presents today for ***  Review of Systems  Cardiovascular: Negative for chest pain.  Musculoskeletal: Negative for joint swelling.  Neurological: Positive for dizziness, numbness (Left arm and tingling) and headaches.    Allergies Patient has No Known Allergies.  Past Medical History Patient  has a past medical history of Anxiety, Encounter for routine gynecological examination, History of chicken pox, History of frequent urinary tract infections, and History of UTI.  Surgical History Patient  has a past surgical history that includes Cesarean section (2005, 2007).  Family History Pateint's family history includes Cancer in her father and paternal grandmother; Depression in her paternal grandfather; Diabetes in her father and paternal grandfather; Heart disease (age of onset: 43) in her maternal grandmother; Hyperlipidemia in her father and mother; Hypertension in her father and mother; Kidney nephrosis in her mother; Learning disabilities in her father; Stroke (age of onset: 79) in her maternal grandmother.  Social History Patient  reports that she has never smoked. She has never used smokeless tobacco. She reports current alcohol use of about 1.0 standard drinks of alcohol per week. She reports that she does not use drugs.    Objective: There were no vitals filed for this visit.  There is no height or weight on file to calculate BMI.  Physical Exam     Assessment/plan:      No follow-ups on file.     @[facility] @ 12/27/2018

## 2018-12-27 NOTE — Progress Notes (Signed)
Patient: Teresa Chan MRN: [id number] DOB: December 13, 1970 PCP: Orma Flaming, MD     I connected with Reubin Milan on 12/27/18 at 9:50am by a video enabled telemedicine application and verified that I am speaking with the correct person using two identifiers.  Location patient: Home Location provider: [facility], Office Persons participating in this virtual visit: brenae lasecki and Dr. Rogers Blocker   I discussed the limitations of evaluation and management by telemedicine and the availability of in person appointments. The patient expressed understanding and agreed to proceed.   Interactive audio and video telecommunications were attempted between this provider and patient, however failed, due to patient having technical difficulties OR patient did not have access to video capability.  We continued and completed visit with audio only.    Subjective:  Chief Complaint  Patient presents with  . Dizziness  . Tingling    HPI: The patient is a 48 y.o. female who presents today for visit for dizziness and tingling. She states last Tuesday she feels low energy, dizzy, fatigued and light headedness. She feels at times her heart is racing, but she feels like this is when she is thinking about things or worrying. On Saturday she had tingling in her left arm that lasted all day and her fingers felt cold and numb. She also had jolts of pain in her left leg. This went away. Last night she was laying in bed and she started to get fast heart/worrying. She is worried about blood clots. She also has had some mild headaches over the past week and has had some diarrhea over the past week. She has not been with anyone and has been quarantined in her house.   Dizzy/light headed: she is not sure how this feels. She just feels like she needs to sit down. She doesn't think she is spinning, it's more light headed. After she sits down she feels better. Happens multiple times through the day, but lasts for a few  minutes until she sits down. When she gets back from walking she feels light headed and needs to lay down for 30 minutes. She feels great when she is walking. She has not checked her blood pressure, but has no hx of this.   She does feel like she is beside herself with worry form the virus. She only has racing heart when she is anxious.   Numbness/tingling in arm: no associated weakness. She does have a huge ganglion cyst on her left wrist. She states it starts in her left axillary region and runs down her entire arm into her fingers. This has only happened Saturday. She has not had since that time. No other symptoms on the left side of her body. Speech fine, face fine, strength fine, gait fine. She did have nerve pain on her left leg that was very intermittent and much less severe than in her left arm. No chest pain, diaphoresis. No symptoms with exercise. No hx of joint pain, vision issues, skin issues. No hx of autoimmune or neurological disease.   After doxy, had her come into office for ekg, exam and labs.   Review of Systems  Constitutional: Positive for fatigue. Negative for chills and fever.  HENT: Negative for congestion, sinus pressure, sinus pain and sore throat.   Eyes: Negative for visual disturbance.  Respiratory: Negative for cough, shortness of breath and wheezing.   Cardiovascular: Positive for palpitations. Negative for chest pain and leg swelling.  Gastrointestinal: Positive for diarrhea. Negative for abdominal pain, nausea  and vomiting.  Genitourinary: Negative for dysuria.  Musculoskeletal: Negative for arthralgias and myalgias.  Skin: Negative for rash.  Neurological: Positive for light-headedness and numbness. Negative for dizziness, syncope, facial asymmetry, speech difficulty and weakness.  Psychiatric/Behavioral: Negative for dysphoric mood. The patient is nervous/anxious.     Allergies Patient has No Known Allergies.  Past Medical History Patient  has a past  medical history of Anxiety, Encounter for routine gynecological examination, History of chicken pox, History of frequent urinary tract infections, and History of UTI.  Surgical History Patient  has a past surgical history that includes Cesarean section (2005, 2007).  Family History Pateint's family history includes Cancer in her father and paternal grandmother; Depression in her paternal grandfather; Diabetes in her father and paternal grandfather; Heart disease (age of onset: 30) in her maternal grandmother; Hyperlipidemia in her father and mother; Hypertension in her father and mother; Kidney nephrosis in her mother; Learning disabilities in her father; Stroke (age of onset: 80) in her maternal grandmother.  Social History Patient  reports that she has never smoked. She has never used smokeless tobacco. She reports current alcohol use of about 1.0 standard drinks of alcohol per week. She reports that she does not use drugs.    Objective: Vitals:   12/27/18 1129  BP: 112/82  Pulse: 81  SpO2: 99%  Weight: 172 lb (78 kg)  Height: 5\' 4"  (1.626 m)    Body mass index is 29.52 kg/m.  Physical Exam Vitals signs reviewed.  Constitutional:      Appearance: Normal appearance.  HENT:     Right Ear: Tympanic membrane, ear canal and external ear normal.     Left Ear: Tympanic membrane, ear canal and external ear normal.     Nose: Nose normal.     Mouth/Throat:     Mouth: Mucous membranes are moist.  Eyes:     Extraocular Movements: Extraocular movements intact.     Pupils: Pupils are equal, round, and reactive to light.  Neck:     Musculoskeletal: Normal range of motion and neck supple.     Vascular: No carotid bruit.  Cardiovascular:     Rate and Rhythm: Normal rate and regular rhythm.     Heart sounds: Normal heart sounds.  Pulmonary:     Effort: Pulmonary effort is normal.     Breath sounds: Normal breath sounds.  Abdominal:     General: Abdomen is flat. Bowel sounds are normal.      Palpations: Abdomen is soft.  Lymphadenopathy:     Cervical: No cervical adenopathy.  Skin:    General: Skin is warm.     Capillary Refill: Capillary refill takes less than 2 seconds.  Neurological:     General: No focal deficit present.     Mental Status: She is alert and oriented to person, place, and time.     Cranial Nerves: No cranial nerve deficit.     Sensory: No sensory deficit.     Motor: No weakness.     Coordination: Coordination normal.     Deep Tendon Reflexes: Reflexes normal.  Psychiatric:        Mood and Affect: Mood normal.        Behavior: Behavior normal.     Ekg: NSR with rate of 73    GAD 7 : Generalized Anxiety Score 12/27/2018 04/14/2018  Nervous, Anxious, on Edge 3 3  Control/stop worrying 3 2  Worry too much - different things 3 3  Trouble relaxing 3 2  Restless 1 0  Easily annoyed or irritable 0 1  Afraid - awful might happen 3 2  Total GAD 7 Score 16 13  Anxiety Difficulty - Not difficult at all     Assessment/plan: 1. Annual physical exam Never came in for labs.. will order this today.  - Lipid panel  2. Tingling Large differential. I do think anxiety is high up on her list of differentials. Has only happened one time and is so stressed out and worried. Normal exam today is also reassuring. Will check labs and go from there. Checking EKG-normal. Hydroxyzine prn and if lab work up normal will start her on daily medication. ER precautions given.  - CBC with Differential/Platelet - Comprehensive metabolic panel - TSH - Vitamin B12  3. Fast heart beat Normal ekg, likely secondary to anxiety. Labs, treating anxiety and monitoring. If continues get worse will get holter. ER precautions given.  - EKG 12-Lead  -had her come into office for labs/ekg and physical exam after our visit.    Return if symptoms worsen or fail to improve.   Orma Flaming, MD [place]  12/27/2018

## 2018-12-27 NOTE — Telephone Encounter (Signed)
See note

## 2018-12-27 NOTE — Patient Instructions (Signed)
Hydroxyzine prn for anxiety. Can take 1/2-1 tab as needed up to three times a day for anxiety. If labs are negative, will talk about daily anxiety medication.   Keep exercising.   -could try ashwagandha at night to help with anxiety and fast thoughts.

## 2018-12-27 NOTE — Telephone Encounter (Signed)
Dr. Rogers Blocker, please see message and advise if you want in office visit or virtual visit?

## 2018-12-28 LAB — VITAMIN B12: Vitamin B-12: 157 pg/mL — ABNORMAL LOW (ref 211–911)

## 2018-12-28 LAB — TSH: TSH: 1.66 u[IU]/mL (ref 0.35–4.50)

## 2018-12-29 LAB — ANTI-NUCLEAR AB-TITER (ANA TITER)
ANA TITER: 1:40 {titer} — ABNORMAL HIGH
ANA Titer 1: 1:40 {titer} — ABNORMAL HIGH

## 2018-12-29 LAB — ANA: Anti Nuclear Antibody (ANA): POSITIVE — AB

## 2018-12-30 ENCOUNTER — Other Ambulatory Visit: Payer: Self-pay | Admitting: Family Medicine

## 2018-12-30 DIAGNOSIS — E538 Deficiency of other specified B group vitamins: Secondary | ICD-10-CM

## 2018-12-30 DIAGNOSIS — R768 Other specified abnormal immunological findings in serum: Secondary | ICD-10-CM

## 2019-04-13 ENCOUNTER — Ambulatory Visit (INDEPENDENT_AMBULATORY_CARE_PROVIDER_SITE_OTHER): Payer: BC Managed Care – PPO

## 2019-04-13 ENCOUNTER — Other Ambulatory Visit: Payer: Self-pay

## 2019-04-13 ENCOUNTER — Encounter: Payer: Self-pay | Admitting: Family Medicine

## 2019-04-13 ENCOUNTER — Ambulatory Visit (INDEPENDENT_AMBULATORY_CARE_PROVIDER_SITE_OTHER): Payer: BC Managed Care – PPO | Admitting: Family Medicine

## 2019-04-13 VITALS — BP 128/82 | HR 84 | Temp 98.6°F | Ht 64.0 in | Wt 178.0 lb

## 2019-04-13 DIAGNOSIS — E538 Deficiency of other specified B group vitamins: Secondary | ICD-10-CM

## 2019-04-13 DIAGNOSIS — R768 Other specified abnormal immunological findings in serum: Secondary | ICD-10-CM | POA: Diagnosis not present

## 2019-04-13 DIAGNOSIS — M545 Low back pain, unspecified: Secondary | ICD-10-CM

## 2019-04-13 DIAGNOSIS — R3129 Other microscopic hematuria: Secondary | ICD-10-CM | POA: Diagnosis not present

## 2019-04-13 LAB — POCT URINALYSIS DIPSTICK
Bilirubin, UA: NEGATIVE
Blood, UA: POSITIVE
Glucose, UA: NEGATIVE
Ketones, UA: NEGATIVE
Leukocytes, UA: NEGATIVE
Nitrite, UA: POSITIVE
Protein, UA: NEGATIVE
Spec Grav, UA: 1.02 (ref 1.010–1.025)
Urobilinogen, UA: 0.2 E.U./dL
pH, UA: 6 (ref 5.0–8.0)

## 2019-04-13 NOTE — Progress Notes (Signed)
Patient: Teresa Chan MRN: [id number] DOB: 13-Oct-1970 PCP: Orma Flaming, MD     Subjective:  Chief Complaint  Patient presents with  . R sided LBP on and off x 1 yr    HPI: The patient is a 48 y.o. female who presents today for right sided lower back pain x 1 year that is intermittent in nature. She states it can be both sides, but it is mainly on the right side. Pain radiates to her hips. Pain rated as a 9/10 when she has it and describes pain as dull, but can be sharp. Walking makes it worse. Rest, sitting, lying down makes it better. She has not taken any medication for this. She states pain will last 5-7 days then go away. Tends to be bad after her period. No weakness or tingling in her lower legs. No trauma to her back that she recalls. She feels like her urine could be more brown when pain is going on. Has flairs about once/month. Hx of kidney stones. No dysuria, increased urgency or frequency.   She states has stuff going on all of the time and thinks something is wrong with her.  +ANA, never followed up for repeat labs.   Review of Systems  Constitutional: Negative for chills, fatigue and fever.  HENT: Negative.   Respiratory: Negative for shortness of breath.   Cardiovascular: Negative.   Gastrointestinal: Positive for abdominal pain. Negative for diarrhea and vomiting.  Endocrine: Negative for polydipsia and polyuria.  Genitourinary: Negative.  Negative for dysuria, flank pain, frequency, hematuria and urgency.  Musculoskeletal: Positive for back pain. Negative for neck pain.       C/o R sided LBP x over 1 yr.  Pain has become progressively worse x the past 5 mos  Skin: Negative.   Neurological: Positive for headaches. Negative for dizziness.  Psychiatric/Behavioral: Negative for sleep disturbance. The patient is not nervous/anxious.     Allergies Patient has No Known Allergies.  Past Medical History Patient  has a past medical history of Anxiety, Encounter for  routine gynecological examination, History of chicken pox, History of frequent urinary tract infections, and History of UTI.  Surgical History Patient  has a past surgical history that includes Cesarean section (2005, 2007).  Family History Pateint's family history includes Cancer in her father and paternal grandmother; Depression in her paternal grandfather; Diabetes in her father and paternal grandfather; Heart disease (age of onset: 25) in her maternal grandmother; Hyperlipidemia in her father and mother; Hypertension in her father and mother; Kidney nephrosis in her mother; Learning disabilities in her father; Stroke (age of onset: 53) in her maternal grandmother.  Social History Patient  reports that she has never smoked. She has never used smokeless tobacco. She reports current alcohol use of about 1.0 standard drinks of alcohol per week. She reports that she does not use drugs.    Objective: Vitals:   04/13/19 1423  BP: 128/82  Pulse: 84  Temp: 98.6 F (37 C)  TempSrc: Temporal  SpO2: 100%  Weight: 178 lb (80.7 kg)  Height: 5\' 4"  (1.626 m)    Body mass index is 30.55 kg/m.  Physical Exam Cardiovascular:     Rate and Rhythm: Normal rate and regular rhythm.     Heart sounds: Normal heart sounds.  Pulmonary:     Effort: Pulmonary effort is normal.     Breath sounds: Normal breath sounds.  Abdominal:     General: Abdomen is flat. Bowel sounds are normal.  Palpations: Abdomen is soft.     Tenderness: There is no abdominal tenderness.  Musculoskeletal: Normal range of motion.     Comments: Negative straight leg tests bilaterally. Sensation intact. Strength intact. Pain in SI area.   Skin:    General: Skin is warm.     Findings: No rash.    Lumbar xray: no acute findings, official read pending  si xray: official read pending     Assessment/plan: 1. Acute right-sided low back pain, unspecified whether sciatica present Possibly sacroilitis. Handout given with  exercises. Recommended conservative therapy with heating pad, NSAIDs. If not better, will send to sports medicine. Also advised she keep a log of her symptoms as it's hard for me to get a good picture when everything is going wrong.  - POCT Urinalysis Dipstick - Urine Culture - DG Lumbar Spine 2-3 Views; Future - DG Si Joints; Future  2. B12 deficiency  - Vitamin B12  3. Other microscopic hematuria Possible UTI. Checking culture and if positive will treat and recheck urine. Also adding on micro to see if true hematuria. May need referral to urology for this and discussed plan.  - Urine Microscopic  4. Positive ANA (antinuclear antibody)  - Ambulatory referral to Rheumatology   Return in about 1 month (around 05/14/2019) for recheck .   Orma Flaming, MD [place]   04/13/2019

## 2019-04-13 NOTE — Patient Instructions (Signed)
-  waiting on urine culture to see if you have an infection before treating. Checking urine undermicroscope to see if true blood. If infection will give medication and then recheck urine. If still has blood then need to see urologist.   -+ANA will do referral to rheumatology. This is your body attacking itself. Usually have weird symptoms.   -keep a log with all of your symptoms  -back pain -heating pad, ibuprofen as needed and exercises. If not better consider seeing sports med vs. Imaging.

## 2019-04-14 LAB — VITAMIN B12: Vitamin B-12: 347 pg/mL (ref 211–911)

## 2019-04-14 LAB — URINALYSIS, MICROSCOPIC ONLY

## 2019-04-15 LAB — URINE CULTURE
MICRO NUMBER:: 763983
SPECIMEN QUALITY:: ADEQUATE

## 2019-04-18 ENCOUNTER — Telehealth: Payer: Self-pay

## 2019-04-18 NOTE — Telephone Encounter (Signed)
Results were reviewed and resulted on Friday. She has no UTI per culture. Please see all of her results and let her know that no antibiotic is needed.  Orma Flaming, MD [place]

## 2019-04-18 NOTE — Telephone Encounter (Signed)
Copied from [place] [id number]. Topic: General - Other >> Apr 18, 2019 11:01 AM Celene Kras A wrote: Reason for CRM: Pt called stating she is still needing medication for her uti. Pt states it was supposed to be sent in last week. Please advise.  [place], [place] - [address] [address] Phone: [phone] Fax: [phone] Not a 24 hour pharmacy; exact hours not known.

## 2019-04-18 NOTE — Telephone Encounter (Signed)
Spoke to patient and advised; she walked into office to get results.  She voiced understanding.

## 2019-07-25 ENCOUNTER — Other Ambulatory Visit: Payer: Self-pay | Admitting: Obstetrics and Gynecology

## 2019-07-25 DIAGNOSIS — R928 Other abnormal and inconclusive findings on diagnostic imaging of breast: Secondary | ICD-10-CM

## 2019-08-03 ENCOUNTER — Other Ambulatory Visit: Payer: Self-pay | Admitting: Obstetrics and Gynecology

## 2019-08-03 ENCOUNTER — Ambulatory Visit
Admission: RE | Admit: 2019-08-03 | Discharge: 2019-08-03 | Disposition: A | Discharge status: Home / Self Care | Payer: BC Managed Care – PPO | Source: Ambulatory Visit | Attending: Obstetrics and Gynecology | Admitting: Obstetrics and Gynecology

## 2019-08-03 ENCOUNTER — Other Ambulatory Visit: Payer: Self-pay

## 2019-08-03 DIAGNOSIS — N6320 Unspecified lump in the left breast, unspecified quadrant: Secondary | ICD-10-CM

## 2019-08-03 DIAGNOSIS — R928 Other abnormal and inconclusive findings on diagnostic imaging of breast: Secondary | ICD-10-CM

## 2019-08-12 ENCOUNTER — Ambulatory Visit
Admission: RE | Admit: 2019-08-12 | Discharge: 2019-08-12 | Disposition: A | Discharge status: Home / Self Care | Payer: BC Managed Care – PPO | Source: Ambulatory Visit | Attending: Obstetrics and Gynecology | Admitting: Obstetrics and Gynecology

## 2019-08-12 ENCOUNTER — Other Ambulatory Visit: Payer: Self-pay

## 2019-08-12 DIAGNOSIS — N6320 Unspecified lump in the left breast, unspecified quadrant: Secondary | ICD-10-CM

## 2019-09-13 ENCOUNTER — Ambulatory Visit: Payer: BC Managed Care – PPO | Attending: Internal Medicine

## 2019-09-13 DIAGNOSIS — Z20822 Contact with and (suspected) exposure to covid-19: Secondary | ICD-10-CM

## 2019-09-14 LAB — NOVEL CORONAVIRUS, NAA: SARS-CoV-2, NAA: NOT DETECTED

## 2019-10-29 ENCOUNTER — Ambulatory Visit: Payer: BC Managed Care – PPO | Attending: Internal Medicine

## 2019-10-29 DIAGNOSIS — Z23 Encounter for immunization: Secondary | ICD-10-CM | POA: Insufficient documentation

## 2019-10-29 NOTE — Progress Notes (Signed)
   Covid-19 Vaccination Clinic  Name:  Teresa Chan    MRN: [id number] DOB: [birth year]  10/29/2019  Teresa Chan was observed post Covid-19 immunization for 15 minutes without incidence. She was provided with Vaccine Information Sheet and instruction to access the V-Safe system.   Teresa Chan was instructed to call 911 with any severe reactions post vaccine: Marland Kitchen Difficulty breathing  . Swelling of your face and throat  . A fast heartbeat  . A bad rash all over your body  . Dizziness and weakness    Immunizations Administered    Name Date Dose VIS Date Route   Pfizer COVID-19 Vaccine 10/29/2019 12:22 PM 0.3 mL 08/12/2019 Intramuscular   Manufacturer: [place]   Lot: [id number]   [place]: [id number]

## 2019-11-19 ENCOUNTER — Ambulatory Visit: Payer: BC Managed Care – PPO | Attending: Internal Medicine

## 2019-11-19 DIAGNOSIS — Z23 Encounter for immunization: Secondary | ICD-10-CM

## 2019-11-19 NOTE — Progress Notes (Signed)
   Covid-19 Vaccination Clinic  Name:  MAECYN WENDLAND    MRN: [id number] DOB: 09-23-1970  11/19/2019  Ms. Mcveigh was observed post Covid-19 immunization for 15 minutes without incident. She was provided with Vaccine Information Sheet and instruction to access the V-Safe system.   Ms. Dannelly was instructed to call 911 with any severe reactions post vaccine: Marland Kitchen Difficulty breathing  . Swelling of face and throat  . A fast heartbeat  . A bad rash all over body  . Dizziness and weakness   Immunizations Administered    Name Date Dose VIS Date Route   Pfizer COVID-19 Vaccine 11/19/2019 12:32 PM 0.3 mL 08/12/2019 Intramuscular   Manufacturer: [place]   Lot: [id number]   [place]: [id number]

## 2019-11-23 ENCOUNTER — Ambulatory Visit: Payer: BC Managed Care – PPO

## 2020-09-08 IMAGING — DX LUMBAR SPINE - 2-3 VIEW
2 series · 2 of 2 positions shown · non-contrast
Comparison: None.

CLINICAL DATA: Low back pain for 1 year.

EXAM:
LUMBAR SPINE - 2-3 VIEW

[lumbar spine ap]
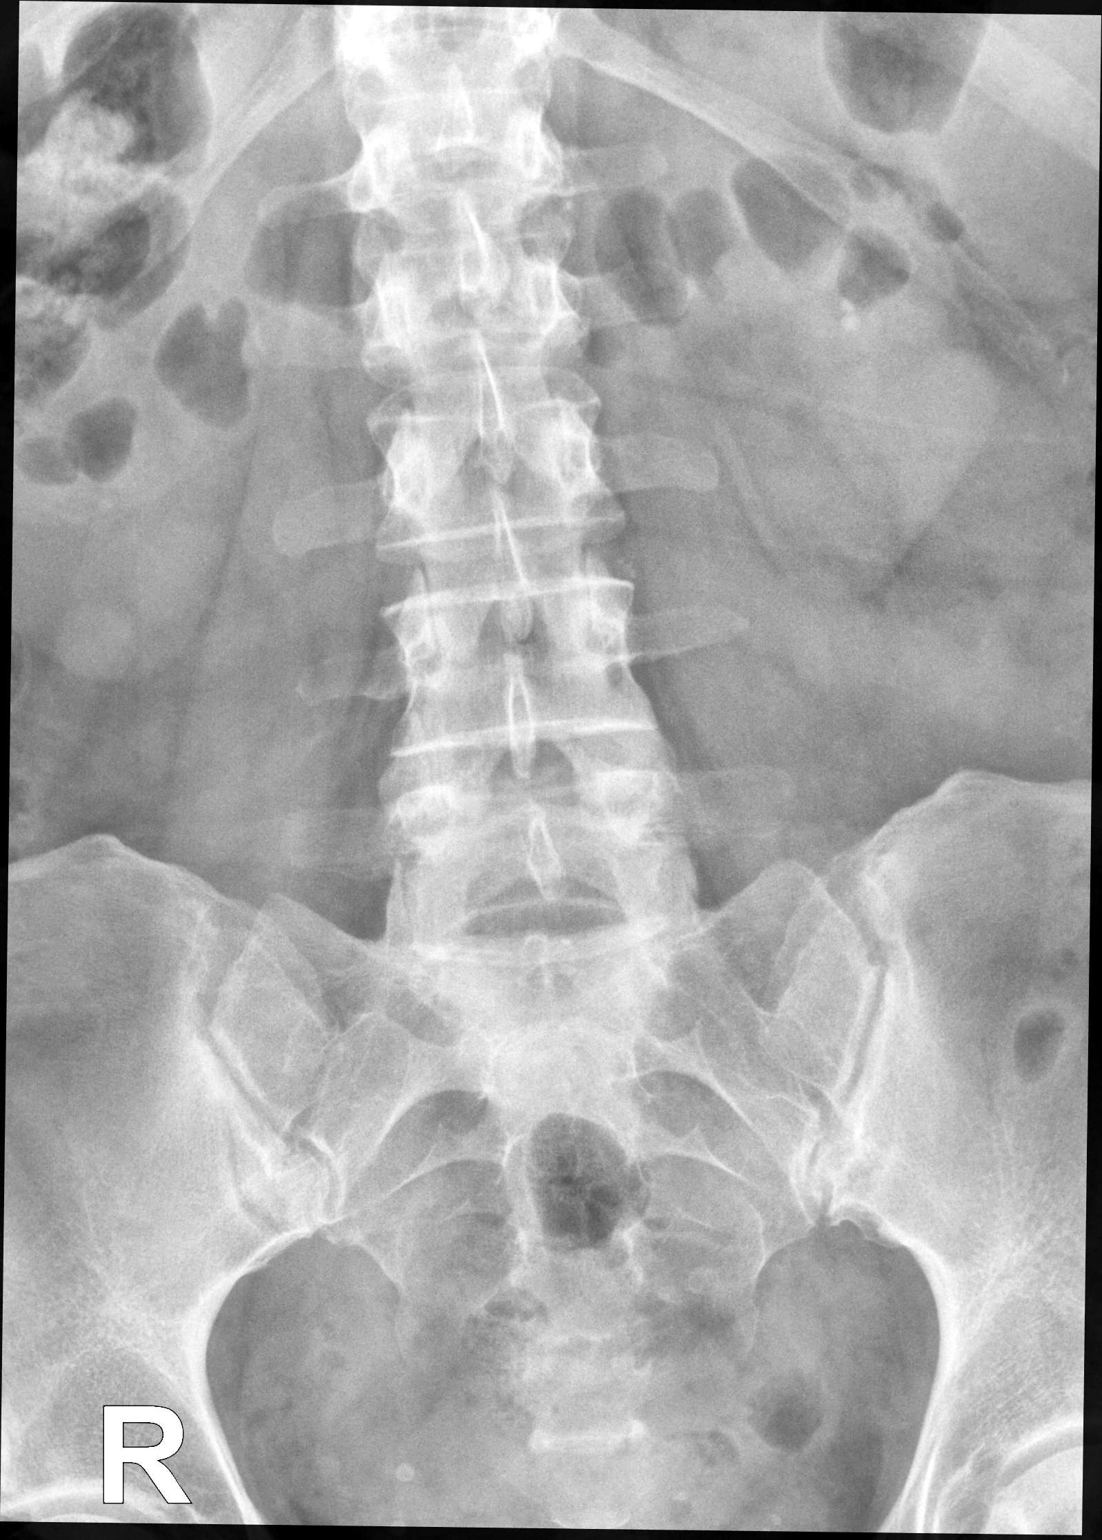

[lumbar spine lat]
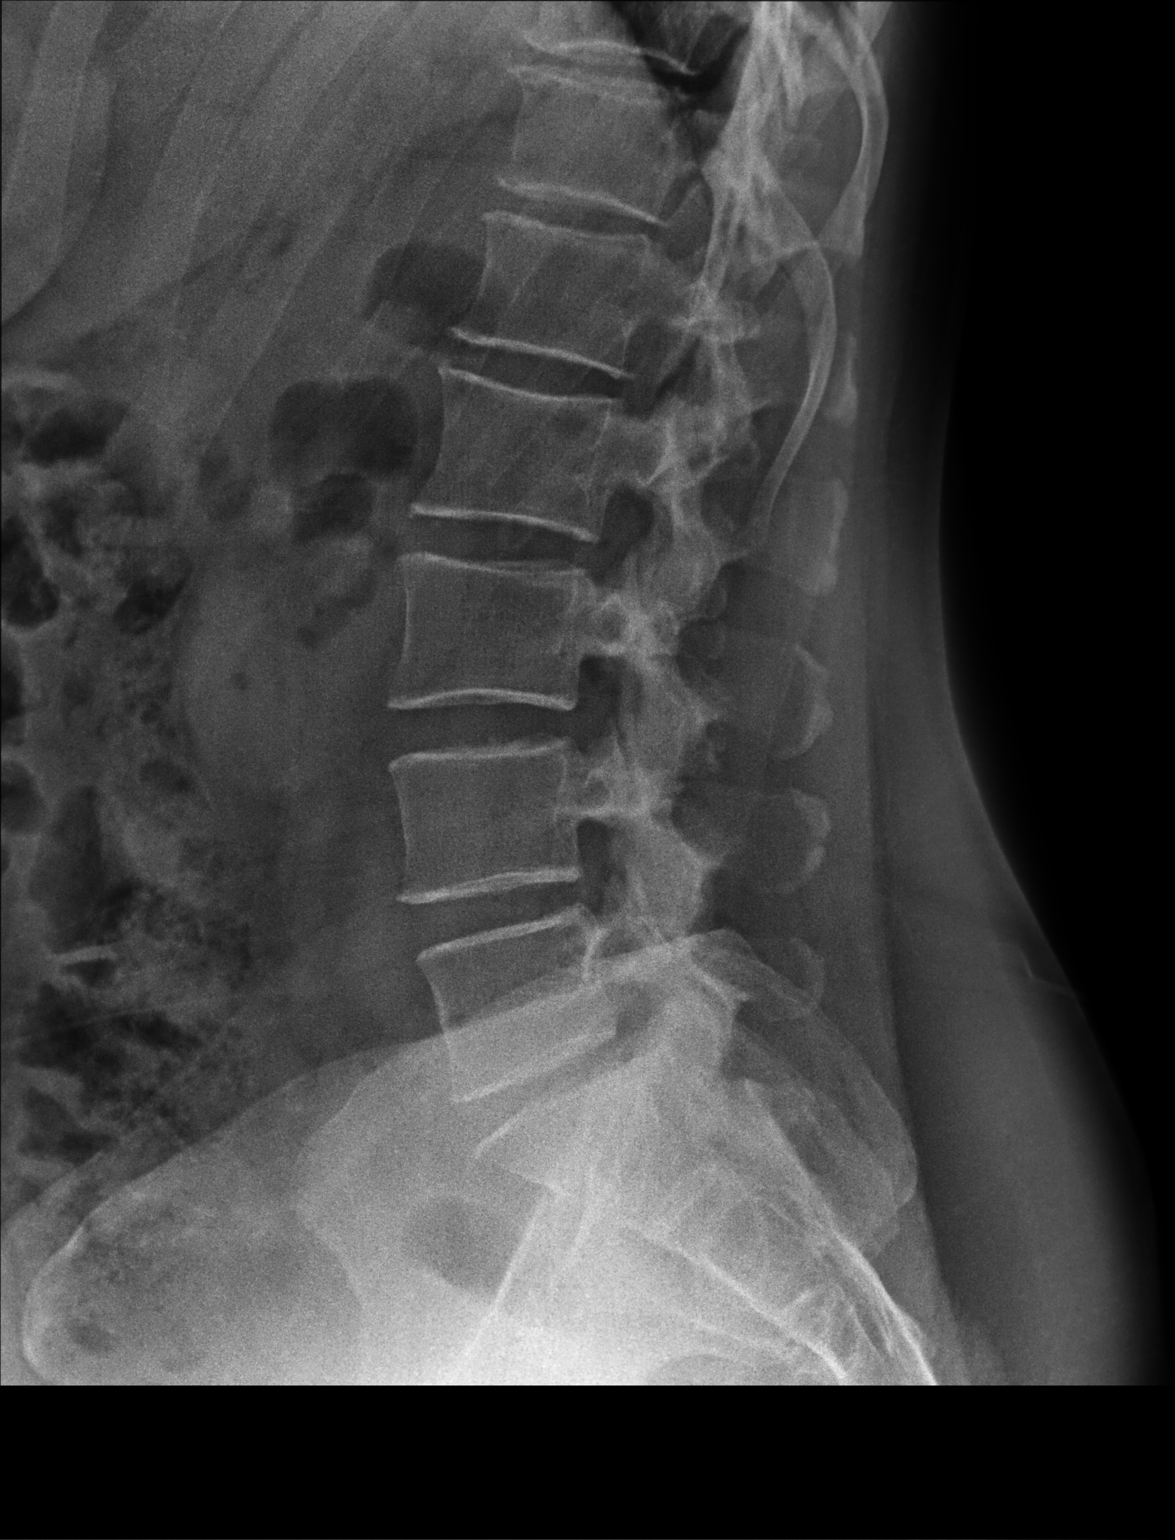

[2 of 2 positions shown; findings below may reference images not displayed]

FINDINGS: There is no evidence of lumbar spine fracture. Alignment is normal.
Intervertebral disc spaces are maintained. Minimal posterior facet
arthropathy at L5-S1.
IMPRESSION: Minimal posterior facet arthropathy at L5-S1.

## 2020-09-08 IMAGING — DX BILATERAL SACROILIAC JOINTS - 3+ VIEW
3 series · 3 of 3 positions shown · non-contrast
Comparison: No recent prior.

CLINICAL DATA: Acute right-sided low back pain.

EXAM:
BILATERAL SACROILIAC JOINTS - 3+ VIEW

[pelvis ap]
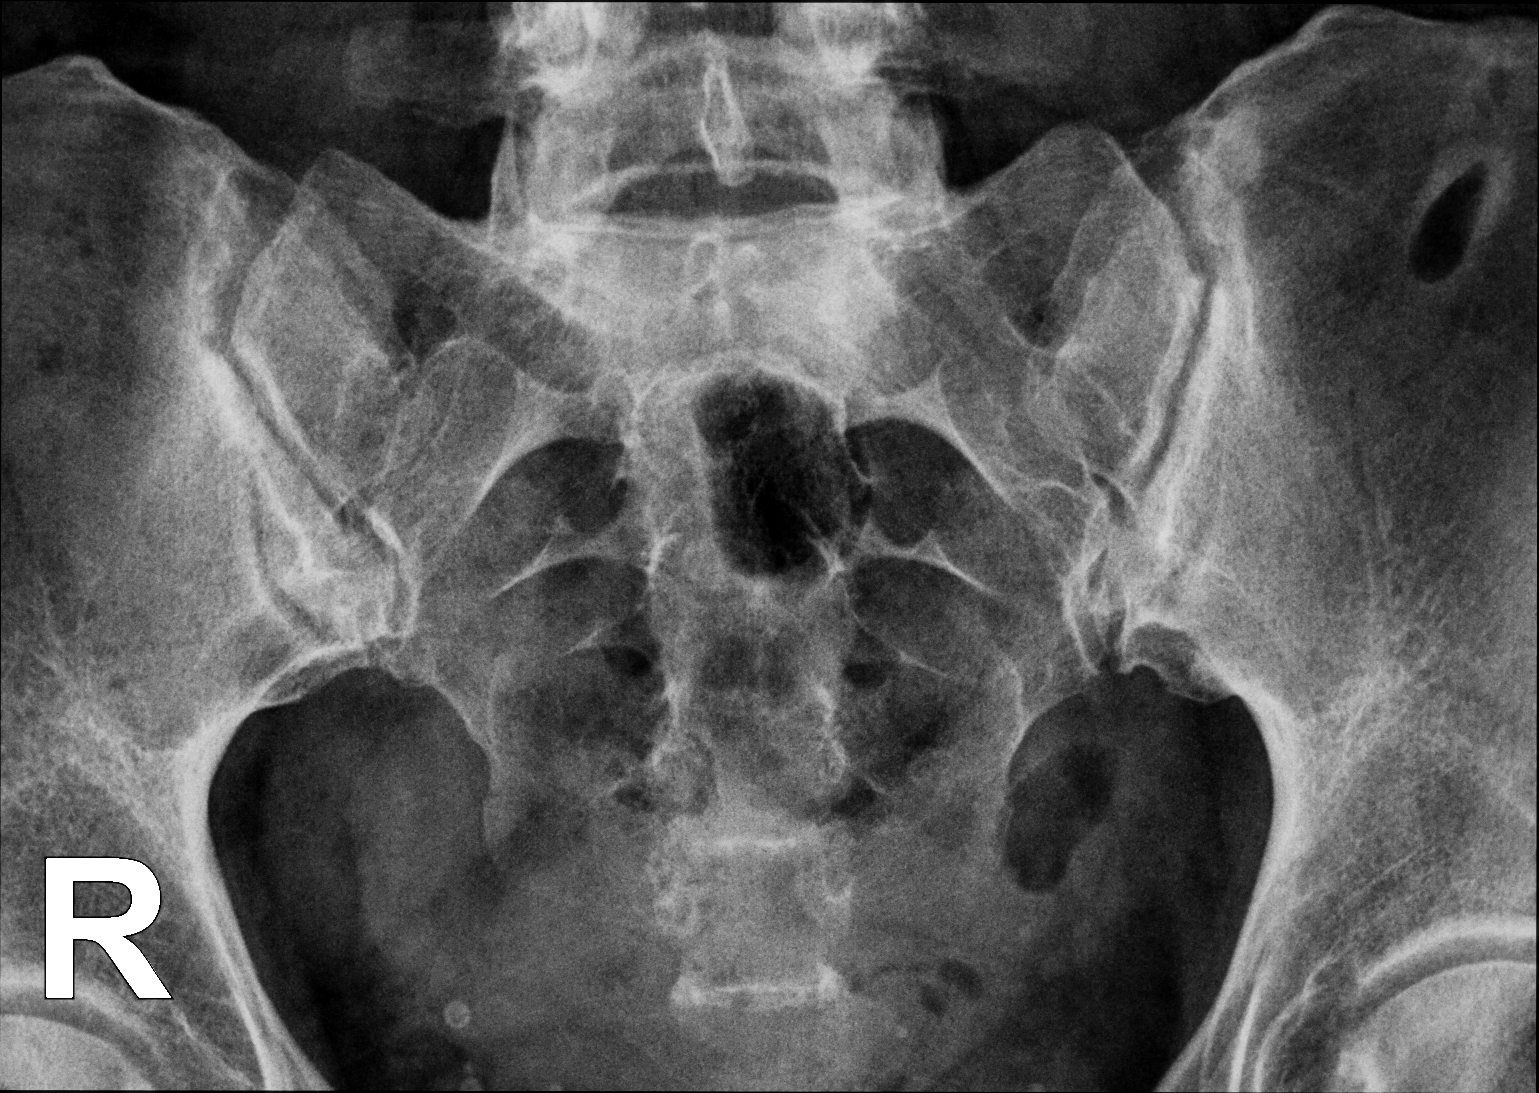

[sacroiliac joint oblique (1 of 2)]
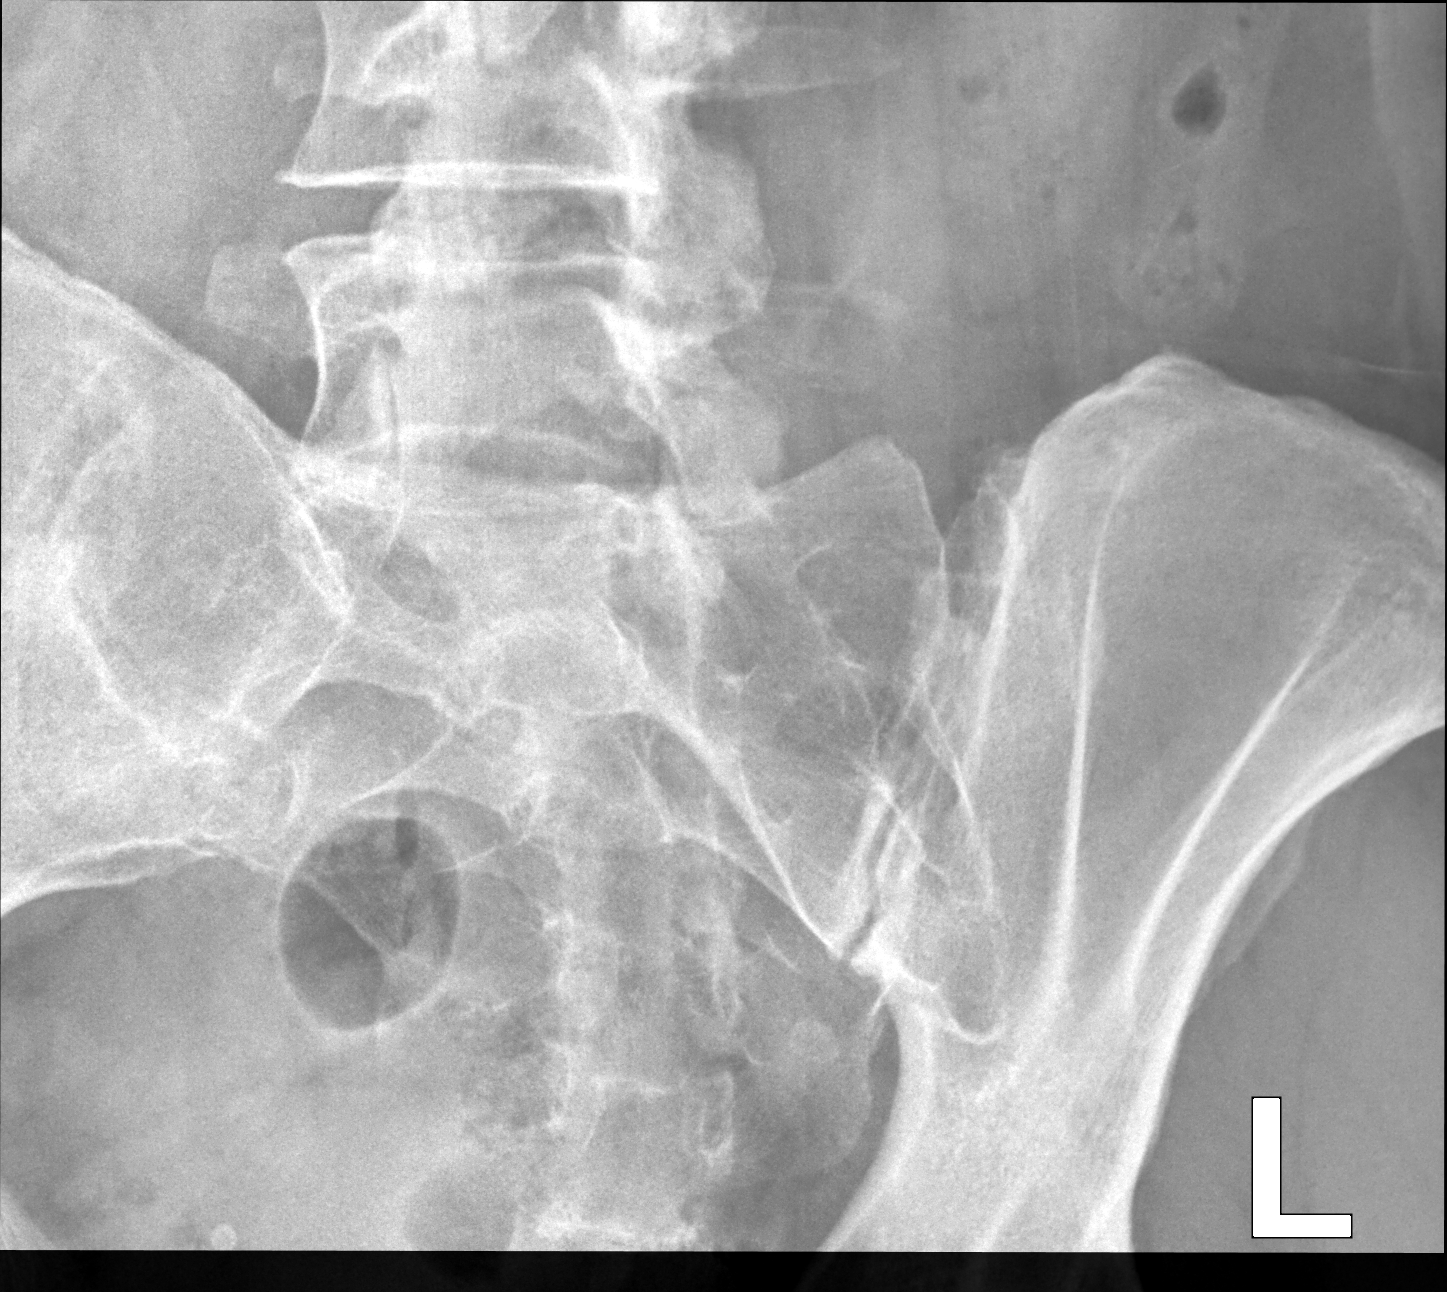

[sacroiliac joint oblique (2 of 2)]
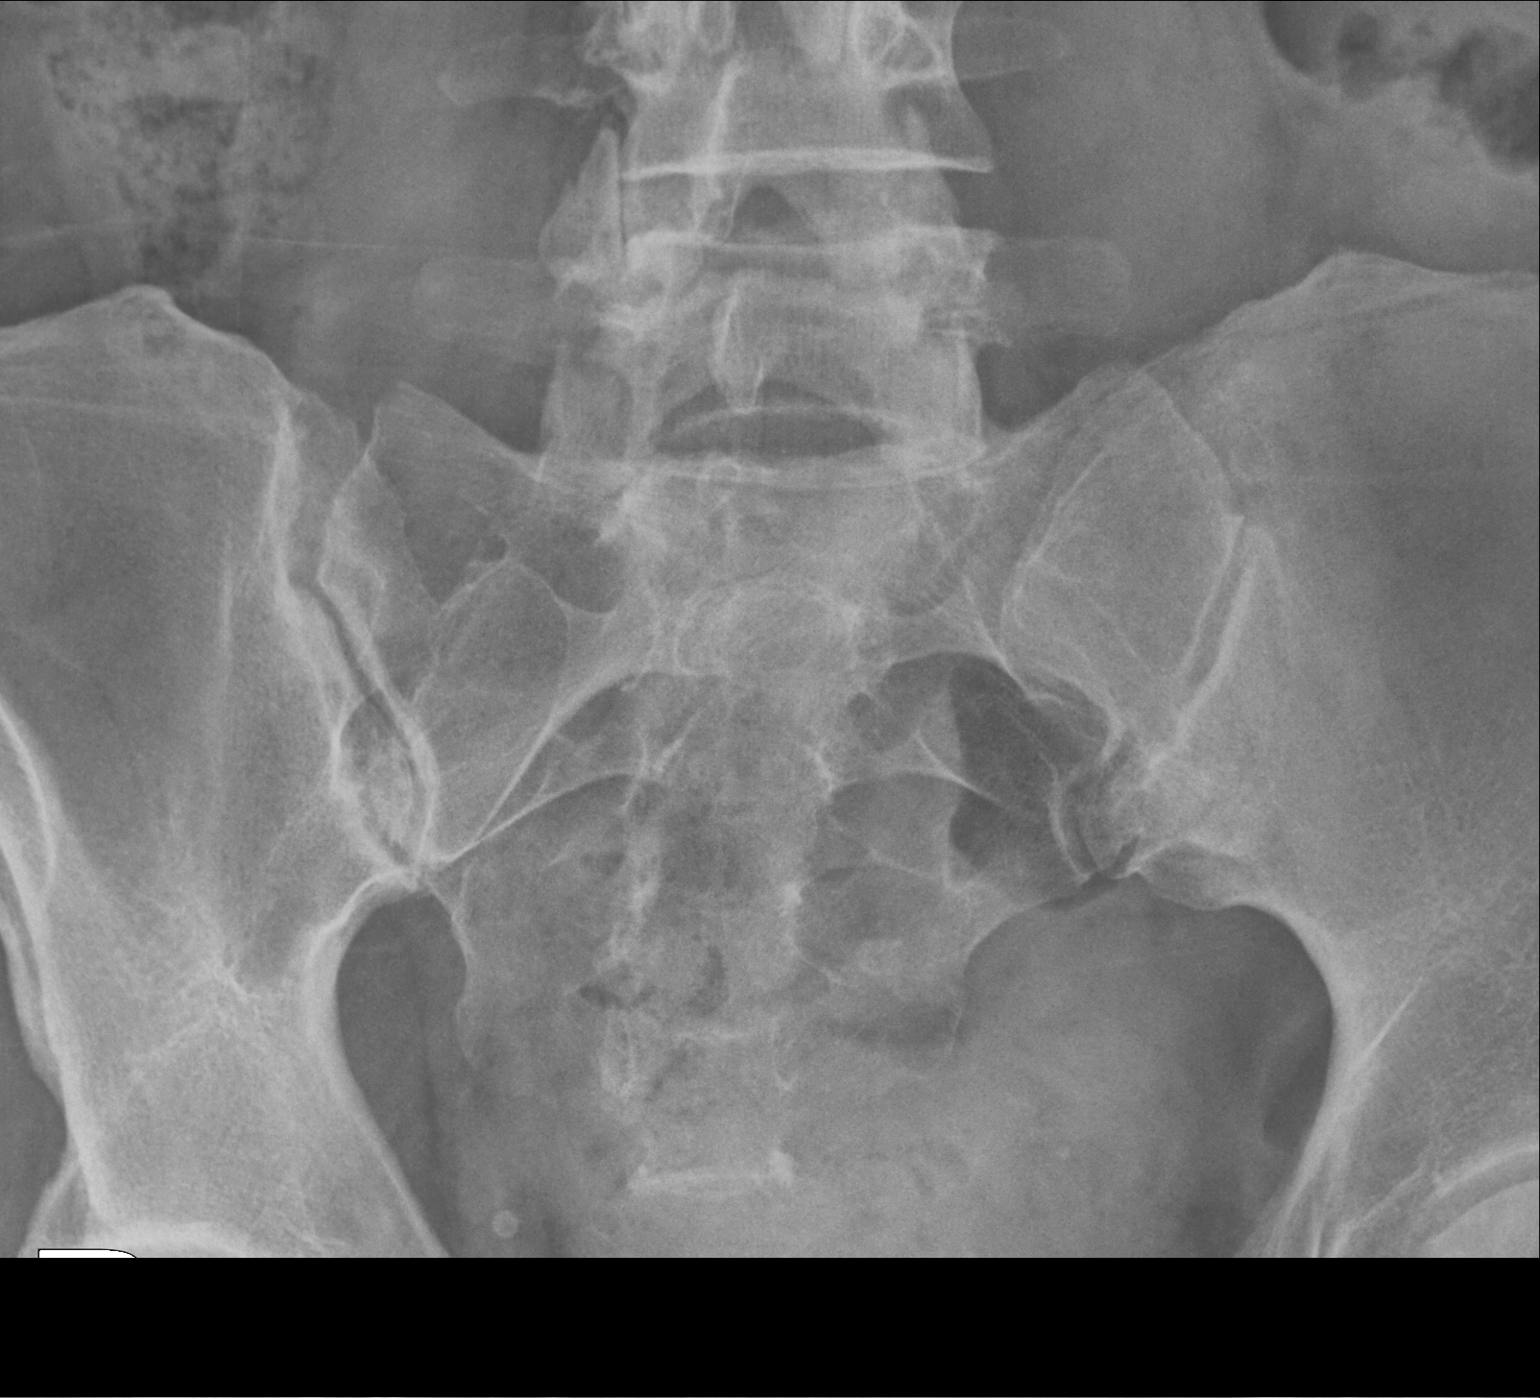

[3 of 3 positions shown; findings below may reference images not displayed]

FINDINGS: Degenerative changes both SI joints. No evidence of inflammatory
arthropathy. No acute bony abnormality identified. Pelvic
calcifications consistent phleboliths.
IMPRESSION: Degenerative changes both SI joints. No evidence of inflammatory
arthropathy. No acute bony abnormality.

## 2020-11-05 ENCOUNTER — Ambulatory Visit: Payer: BC Managed Care – PPO | Admitting: Family Medicine

## 2021-06-14 ENCOUNTER — Encounter: Payer: Self-pay | Admitting: Physician Assistant

## 2021-06-14 ENCOUNTER — Telehealth: Payer: BC Managed Care – PPO | Admitting: Physician Assistant

## 2021-06-14 VITALS — Ht 64.0 in

## 2021-06-14 DIAGNOSIS — J020 Streptococcal pharyngitis: Secondary | ICD-10-CM

## 2021-06-14 LAB — POCT RAPID STREP A (OFFICE): Rapid Strep A Screen: POSITIVE — AB

## 2021-06-14 MED ORDER — AMOXICILLIN 500 MG PO CAPS: 500.0000 mg | ORAL_CAPSULE | Freq: Two times a day (BID) | ORAL | 0 refills | Status: AC

## 2021-06-14 NOTE — Progress Notes (Signed)
Virtual Visit via Video Note  I connected with  Reubin Milan  on 06/14/21 at 11:30 AM EDT by a video enabled telemedicine application and verified that I am speaking with the correct person using two identifiers.  Location: Patient: Parked car  Provider: Therapist, music at [place] present: Patient and myself   I discussed the limitations of evaluation and management by telemedicine and the availability of in person appointments. The patient expressed understanding and agreed to proceed.   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   Assessment and Plan:  1. Acute streptococcal pharyngitis The patient came to our office for a rapid strep test, which came back positive.  I was able to discuss with her on video visit and treat with amoxicillin 500 mg twice a day for the next 10 days.  She will continue Tylenol and ibuprofen as needed, as well as throat lozenges and other home remedies for sore throat.  She will recheck in office if worse or no improvement next week.   Follow Up Instructions:    I discussed the assessment and treatment plan with the patient. The patient was provided an opportunity to ask questions and all were answered. The patient agreed with the plan and demonstrated an understanding of the instructions.   The patient was advised to call back or seek an in-person evaluation if the symptoms worsen or if the condition fails to improve as anticipated.  Cuthbert Turton M Cybill Uriegas, PA-C

## 2021-10-30 ENCOUNTER — Other Ambulatory Visit: Payer: Self-pay

## 2021-10-30 ENCOUNTER — Encounter: Payer: Self-pay | Admitting: Family

## 2021-10-30 ENCOUNTER — Ambulatory Visit: Payer: BC Managed Care – PPO | Admitting: Family

## 2021-10-30 VITALS — BP 131/86 | HR 82 | Temp 98.5°F | Ht 64.0 in | Wt 184.4 lb

## 2021-10-30 DIAGNOSIS — R000 Tachycardia, unspecified: Secondary | ICD-10-CM | POA: Diagnosis not present

## 2021-10-30 NOTE — Progress Notes (Signed)
? ?Subjective:  ? ? ? Patient ID: Teresa Chan, female    DOB: [birth year], 51 y.o.   MRN: [id number] ? ?Chief Complaint  ?Patient presents with  ? Transitions Of Care  ? rapid heart beat  ?  Pt complains of racing heart rate. She says that she had an episode on Sunday. She denies nausea or vomiting when this starts. She lays down, but does not help.   ? ? ?HPI: ?Tachycardia:  with palpitations, started after she had Covid in 2020. Denies any anxiety, lasts for at least an hour, has a pulse oximeter, tries to meditate and deep breathe, but doesn't work, longest time between episodes was about 6 weeks, and she thinks they are fewer than right after Covid, but doesn't understand why still occurring. Denies SOB or nausea, does report dizziness. ? ?Health Maintenance Due  ?Topic Date Due  ? Hepatitis C Screening  Never done  ? TETANUS/TDAP  Never done  ? COLONOSCOPY (Pts 45-1yrs Insurance coverage will need to be confirmed)  Never done  ? MAMMOGRAM  07/09/2018  ? ? ?Past Medical History:  ?Diagnosis Date  ? Anxiety   ? Encounter for routine gynecological examination   ? Dr. Corinna Capra  ? History of chicken pox   ? History of frequent urinary tract infections   ? History of UTI   ? ? ?Past Surgical History:  ?Procedure Laterality Date  ? CESAREAN SECTION  2005, 2007  ? x2  ? ? ?No outpatient medications prior to visit.  ? ?No facility-administered medications prior to visit.  ? ? ?No Known Allergies ? ? ?   ?Objective:  ?  ?Physical Exam ?Vitals and nursing note reviewed.  ?Constitutional:   ?   Appearance: Normal appearance. She is obese.  ?Cardiovascular:  ?   Rate and Rhythm: Normal rate and regular rhythm.  ?   Heart sounds: Normal heart sounds.  ?Pulmonary:  ?   Effort: Pulmonary effort is normal.  ?   Breath sounds: Normal breath sounds.  ?Musculoskeletal:     ?   General: Normal range of motion.  ?   Right lower leg: No edema.  ?   Left lower leg: No edema.  ?Skin: ?   General: Skin is warm and dry.  ?Neurological:   ?   Mental Status: She is alert.  ?Psychiatric:     ?   Mood and Affect: Mood normal.     ?   Behavior: Behavior normal.  ? ? ?BP 131/86   Pulse 82   Temp 98.5 ?F (36.9 ?C) (Temporal)   Ht 5\' 4"  (1.626 m)   Wt 184 lb 6.4 oz (83.6 kg)   SpO2 98%   BMI 31.65 kg/m?  ?Wt Readings from Last 3 Encounters:  ?10/30/21 184 lb 6.4 oz (83.6 kg)  ?04/13/19 178 lb (80.7 kg)  ?12/27/18 172 lb (78 kg)  ? ? ?   ?Assessment & Plan:  ? ?Problem List Items Addressed This Visit   ?None ?Visit Diagnoses   ? ? Racing heart beat    -  Primary  ? pt reports having since Covid in 2020. episodes less frequent, but just as intense, at random times, and she is not sure why still happening. denies any anxiety or excess caffeine intake. also reports dizziness, but no SOB, CP, or nausea. ECG normal today. Referring to Cardiology. ? ?Relevant Orders  ? EKG 12-Lead (Completed)  ? Ambulatory referral to Cardiology  ? ?  ? ? ? ? ?

## 2021-10-30 NOTE — Patient Instructions (Addendum)
It was very nice to see you today! ? ?I have sent a referral to Cardiology regarding your tachycardia, they will call you directly. ?Remember to drink at least 8 cups of water daily, limit your caffeine intake. ?Continue to exercise, elevating heart rate for short periods as this is a good exercise for your heart muscle. ?If you experience another episode of sustained racing heart rate, try to get to an urgent care of [facility] to have an ECG done quickly (if prior to seeing Cardiology). ? ? ? ?PLEASE NOTE: ? ?If you had any lab tests please let us know if you have not heard back within a few days. You may see your results on MyChart before we have a chance to review them but we will give you a call once they are reviewed by [place]. If we ordered any referrals today, please let us know if you have not heard from their office within the next week.  ? ?Please try these tips to maintain a healthy lifestyle: ? ?Eat most of your calories during the day when you are active. Eliminate processed foods including packaged sweets (pies, cakes, cookies), reduce intake of potatoes, white bread, white pasta, and white rice. Look for whole grain options, oat flour or almond flour. ? ?Each meal should contain half fruits/vegetables, one quarter protein, and one quarter carbs (no bigger than a computer mouse). ? ?Cut down on sweet beverages. This includes juice, soda, and sweet tea. Also watch fruit intake, though this is a healthier sweet option, it still contains natural sugar! Limit to 3 servings daily. ? ?Drink at least 1 glass of water with each meal and aim for at least 8 glasses per day ? ?Exercise at least 150 minutes every week.  ? ?

## 2021-10-31 ENCOUNTER — Ambulatory Visit: Payer: BC Managed Care – PPO | Admitting: Cardiovascular Disease

## 2021-10-31 ENCOUNTER — Encounter: Payer: Self-pay | Admitting: Cardiovascular Disease

## 2021-10-31 VITALS — BP 122/78 | HR 73 | Ht 64.0 in | Wt 186.0 lb

## 2021-10-31 DIAGNOSIS — R002 Palpitations: Secondary | ICD-10-CM | POA: Diagnosis not present

## 2021-10-31 NOTE — Patient Instructions (Signed)
Medication Instructions:  ?The current medical regimen is effective;  continue present plan and medications. ? ?*If you need a refill on your cardiac medications before your next appointment, please call your pharmacy* ? ? ?Lab Work: ?CBC, BMET, TSH, Free T4, T3, LIPID, LIVER today  ? ?If you have labs (blood work) drawn today and your tests are completely normal, you will receive your results only by: ?MyChart Message (if you have MyChart) OR ?A paper copy in the mail ?If you have any lab test that is abnormal or we need to change your treatment, we will call you to review the results. ? ? ?Testing/Procedures: ?Echocardiogram - Your physician has requested that you have an echocardiogram. Echocardiography is a painless test that uses sound waves to create images of your heart. It provides your doctor with information about the size and shape of your heart and how well your heart?s chambers and valves are working. This procedure takes approximately one hour. There are no restrictions for this procedure. This will be performed at either our [facility] location - [address], [place] location [facility] 2nd floor. ? ?Your physician has recommended that you wear an event monitor. Event monitors are medical devices that record the heart?s electrical activity. Doctors most often [place] these monitors to diagnose arrhythmias. Arrhythmias are problems with the speed or rhythm of the heartbeat. The monitor is a small, portable device. You can wear one while you do your normal daily activities. This is usually used to diagnose what is causing palpitations/syncope (passing out). ? ? ? ?Follow-Up: ?At [facility], you and your health needs are our priority.  As part of our continuing mission to provide you with exceptional heart care, we have created designated Provider Care Teams.  These Care Teams include your primary Cardiologist (physician) and Advanced Practice Providers (APPs -   Physician Assistants and Nurse Practitioners) who all work together to provide you with the care you need, when you need it. ? ?We recommend signing up for the patient portal called "MyChart".  Sign up information is provided on this After Visit Summary.  MyChart is used to connect with patients for Virtual Visits (Telemedicine).  Patients are able to view lab/test results, encounter notes, upcoming appointments, etc.  Non-urgent messages can be sent to your provider as well.   ?To learn more about what you can do with MyChart, go to [email or web address].   ? ?Your next appointment:   ?3 month(s) ? ?The format for your next appointment:   ?In Person ? ?Provider:   ?Quay Burow, MD  ? ? ? ?

## 2021-10-31 NOTE — Assessment & Plan Note (Addendum)
Teresa Chan was referred by Teresa Sewer NP for evaluation of palpitations.  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  She does drink caffeine and tea on a daily basis.  There is no family history of heart disease.  She is never had a heart attack or stroke.  She denies chest pain or shortness of breath.  She does walk 2 to 3 days a week.  She had COVID back in 2020 and since that time she has had episodes of tachypalpitations lasting for hours at a time associated with dizziness.  These now occur every several months.  She had one recently.  The episodes sound like PSVT.  I am going to get thyroid function test, 2D echo and a 30-day event monitor.  If she does not have an episode while wearing the event monitor she may ultimately require a loop recorder implant. ?

## 2021-10-31 NOTE — Progress Notes (Signed)
? ? ? ?10/31/2021 ?Teresa Chan   ?07-21-1971  ?[id number] ? ?Primary Physician Jeanie Sewer, NP ?Primary Cardiologist: Lorretta Harp MD Lupe Carney, [place] ? ?HPI:  Teresa Chan is a 51 y.o. mild to moderately overweight married Caucasian female mother of 2 children who works as an Best boy.  She was referred by her primary care provider who she saw yesterday, Jeanie Sewer  NP, for evaluation of palpitations.  She has no cardiac risk factors.  She does drink caffeine (coffee and tea) on a daily basis.  There is no family history of heart disease.  She has never had heart attack or stroke.  She denies chest pain or shortness of breath.  She walks 2 to 3 days a week.  She did have COVID back in 2020 after which she developed palpitations which were more frequent but then and now occur every several months.  She had a recent episode that lasted for several hours during which time she felt weak and dizzy.  It resolved spontaneously. ? ? ?No outpatient medications have been marked as taking for the 10/31/21 encounter (Office Visit) with Lorretta Harp, MD.  ?  ? ?No Known Allergies ? ?Social History  ? ?Socioeconomic History  ? Marital status: Married  ?  Spouse name: Not on file  ? Number of children: Not on file  ? Years of education: Not on file  ? Highest education level: Not on file  ?Occupational History  ? Not on file  ?Tobacco Use  ? Smoking status: Never  ? Smokeless tobacco: Never  ?Vaping Use  ? Vaping Use: Never used  ?Substance and Sexual Activity  ? Alcohol use: Yes  ?  Alcohol/week: 1.0 standard drink  ?  Types: 1 Cans of beer per week  ? Drug use: No  ? Sexual activity: Yes  ?  Partners: Male  ?Other Topics Concern  ? Not on file  ?Social History Narrative  ? Married, 2 children, 9yo and 67yo.   Homemaker.  Sews, quilts, bike.   ? ?Social Determinants of Health  ? ?Financial Resource Strain: Not on file  ?Food Insecurity: Not on file   ?Transportation Needs: Not on file  ?Physical Activity: Not on file  ?Stress: Not on file  ?Social Connections: Not on file  ?Intimate Partner Violence: Not on file  ?  ? ?Review of Systems: ?General: negative for chills, fever, night sweats or weight changes.  ?Cardiovascular: negative for chest pain, dyspnea on exertion, edema, orthopnea, palpitations, paroxysmal nocturnal dyspnea or shortness of breath ?Dermatological: negative for rash ?Respiratory: negative for cough or wheezing ?Urologic: negative for hematuria ?Abdominal: negative for nausea, vomiting, diarrhea, bright red blood per rectum, melena, or hematemesis ?Neurologic: negative for visual changes, syncope, or dizziness ?All other systems reviewed and are otherwise negative except as noted above. ? ? ? ?Blood pressure 122/78, pulse 73, height 5\' 4"  (1.626 m), weight 186 lb (84.4 kg), SpO2 99 %.  ?General appearance: alert and no distress ?Neck: no adenopathy, no carotid bruit, no JVD, supple, symmetrical, trachea midline, and thyroid not enlarged, symmetric, no tenderness/mass/nodules ?Lungs: clear to auscultation bilaterally ?Heart: regular rate and rhythm, S1, S2 normal, no murmur, click, rub or gallop ?Extremities: extremities normal, atraumatic, no cyanosis or edema ?Pulses: 2+ and symmetric ?Skin: Skin color, texture, turgor normal. No rashes or lesions ?Neurologic: Grossly normal ? ?EKG sinus rhythm at 73 without ST or T wave changes.  I personally reviewed this EKG. ? ?  ASSESSMENT AND PLAN:  ? ?Palpitations ?Ms Teresa Chan was referred by Jeanie Sewer NP for evaluation of palpitations.  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  She does drink caffeine and tea on a daily basis.  There is no family history of heart disease.  She is never had a heart attack or stroke.  She denies chest pain or shortness of breath.  She does walk 2 to 3 days a week.  She had COVID back in 2020 and since  that time she has had episodes of tachypalpitations lasting for hours at a time associated with dizziness.  These now occur every several months.  She had one recently.  The episodes sound like PSVT.  I am going to get thyroid function test, 2D echo and a 30-day event monitor.  If she does not have an episode while wearing the event monitor she may ultimately require a loop recorder implant. ? ? ? ? ?Lorretta Harp MD FACP,FACC,FAHA, FSCAI ?10/31/2021 ?3:49 PM ?

## 2021-11-01 LAB — CBC
Hematocrit: 34.1 % (ref 34.0–46.6)
Hemoglobin: 10.3 g/dL — ABNORMAL LOW (ref 11.1–15.9)
MCH: 22.6 pg — ABNORMAL LOW (ref 26.6–33.0)
MCHC: 30.2 g/dL — ABNORMAL LOW (ref 31.5–35.7)
MCV: 75 fL — ABNORMAL LOW (ref 79–97)
Platelets: 320 10*3/uL (ref 150–450)
RBC: 4.56 x10E6/uL (ref 3.77–5.28)
RDW: 14.8 % (ref 11.7–15.4)
WBC: 5.9 10*3/uL (ref 3.4–10.8)

## 2021-11-01 LAB — HEPATIC FUNCTION PANEL
ALT: 14 IU/L (ref 0–32)
AST: 16 IU/L (ref 0–40)
Albumin: 4.8 g/dL (ref 3.8–4.8)
Alkaline Phosphatase: 54 IU/L (ref 44–121)
Bilirubin Total: <0.2 mg/dL (ref 0.0–1.2)
Bilirubin, Direct: <0.1 mg/dL (ref 0.00–0.40)
Total Protein: 6.9 g/dL (ref 6.0–8.5)

## 2021-11-01 LAB — BASIC METABOLIC PANEL
BUN/Creatinine Ratio: 22 (ref 9–23)
BUN: 17 mg/dL (ref 6–24)
CO2: 21 mmol/L (ref 20–29)
Calcium: 10.3 mg/dL — ABNORMAL HIGH (ref 8.7–10.2)
Chloride: 103 mmol/L (ref 96–106)
Creatinine, Ser: 0.78 mg/dL (ref 0.57–1.00)
Glucose: 95 mg/dL (ref 70–99)
Potassium: 4.7 mmol/L (ref 3.5–5.2)
Sodium: 145 mmol/L — ABNORMAL HIGH (ref 134–144)
eGFR: 94 mL/min/{1.73_m2} (ref >59)

## 2021-11-01 LAB — T4, FREE: Free T4: 1.06 ng/dL (ref 0.82–1.77)

## 2021-11-01 LAB — LIPID PANEL
Chol/HDL Ratio: 3.3 ratio (ref 0.0–4.4)
Cholesterol, Total: 177 mg/dL (ref 100–199)
HDL: 53 mg/dL (ref >39)
LDL Chol Calc (NIH): 90 mg/dL (ref 0–99)
Triglycerides: 199 mg/dL — ABNORMAL HIGH (ref 0–149)
VLDL Cholesterol Cal: 34 mg/dL (ref 5–40)

## 2021-11-01 LAB — T3: T3, Total: 128 ng/dL (ref 71–180)

## 2021-11-01 LAB — TSH: TSH: 2.63 u[IU]/mL (ref 0.450–4.500)

## 2021-11-03 NOTE — Progress Notes (Signed)
Per cardiology labs and low Hemoglobin- let Sita know it is just slightly low - she should eat an iron-rich diet: lean red meat, dark greens, nuts, iron fortified oatmeal or cereals, peas, etc.  ?If she is having heavy menstrual periods, I'd recommend she take an over the counter iron pill ('65mg'$ ) a few days prior to her cycle, during, and a few days after.   ?This can cause her stool to turn dark or black  but is normal. ?Also she should take it on empty stomach for better absorption, but if causes nausea, ok to take with a little food. ?any questions, let me know! thx

## 2021-11-04 ENCOUNTER — Telehealth: Payer: Self-pay

## 2021-11-04 NOTE — Telephone Encounter (Signed)
Called patient to follow up

## 2021-11-20 ENCOUNTER — Other Ambulatory Visit: Payer: Self-pay

## 2021-11-20 ENCOUNTER — Ambulatory Visit (HOSPITAL_COMMUNITY): Payer: BC Managed Care – PPO | Attending: Internal Medicine

## 2021-11-20 DIAGNOSIS — I340 Nonrheumatic mitral (valve) insufficiency: Secondary | ICD-10-CM

## 2021-11-20 DIAGNOSIS — R002 Palpitations: Secondary | ICD-10-CM | POA: Insufficient documentation

## 2021-11-20 DIAGNOSIS — I341 Nonrheumatic mitral (valve) prolapse: Secondary | ICD-10-CM | POA: Diagnosis not present

## 2021-11-20 LAB — ECHOCARDIOGRAM COMPLETE
Area-P 1/2: 2.91 cm2
S' Lateral: 2.8 cm

## 2021-12-28 ENCOUNTER — Emergency Department (HOSPITAL_COMMUNITY): Payer: BC Managed Care – PPO

## 2021-12-28 ENCOUNTER — Emergency Department (HOSPITAL_COMMUNITY)
Admission: EM | Admit: 2021-12-28 | Discharge: 2021-12-29 | Disposition: A | Discharge status: Home / Self Care | Payer: BC Managed Care – PPO | Attending: Emergency Medicine | Admitting: Emergency Medicine

## 2021-12-28 ENCOUNTER — Other Ambulatory Visit: Payer: Self-pay

## 2021-12-28 DIAGNOSIS — W010XXA Fall on same level from slipping, tripping and stumbling without subsequent striking against object, initial encounter: Secondary | ICD-10-CM | POA: Diagnosis not present

## 2021-12-28 DIAGNOSIS — S32049A Unspecified fracture of fourth lumbar vertebra, initial encounter for closed fracture: Secondary | ICD-10-CM | POA: Diagnosis not present

## 2021-12-28 DIAGNOSIS — D259 Leiomyoma of uterus, unspecified: Secondary | ICD-10-CM | POA: Insufficient documentation

## 2021-12-28 DIAGNOSIS — N281 Cyst of kidney, acquired: Secondary | ICD-10-CM | POA: Diagnosis not present

## 2021-12-28 DIAGNOSIS — S3992XA Unspecified injury of lower back, initial encounter: Secondary | ICD-10-CM | POA: Diagnosis present

## 2021-12-28 DIAGNOSIS — S32009A Unspecified fracture of unspecified lumbar vertebra, initial encounter for closed fracture: Secondary | ICD-10-CM

## 2021-12-28 LAB — URINALYSIS, ROUTINE W REFLEX MICROSCOPIC
Bilirubin Urine: NEGATIVE
Glucose, UA: NEGATIVE mg/dL
Ketones, ur: NEGATIVE mg/dL
Leukocytes,Ua: NEGATIVE
Nitrite: NEGATIVE
Protein, ur: 30 mg/dL — AB
RBC / HPF: >50 RBC/hpf — ABNORMAL HIGH (ref 0–5)
Specific Gravity, Urine: 1.014 (ref 1.005–1.030)
pH: 5 (ref 5.0–8.0)

## 2021-12-28 LAB — COMPREHENSIVE METABOLIC PANEL
ALT: 13 U/L (ref 0–44)
AST: 20 U/L (ref 15–41)
Albumin: 4.2 g/dL (ref 3.5–5.0)
Alkaline Phosphatase: 57 U/L (ref 38–126)
Anion gap: 8 (ref 5–15)
BUN: 13 mg/dL (ref 6–20)
CO2: 22 mmol/L (ref 22–32)
Calcium: 9.7 mg/dL (ref 8.9–10.3)
Chloride: 110 mmol/L (ref 98–111)
Creatinine, Ser: 0.94 mg/dL (ref 0.44–1.00)
GFR, Estimated: >60 mL/min (ref >60)
Glucose, Bld: 108 mg/dL — ABNORMAL HIGH (ref 70–99)
Potassium: 3.7 mmol/L (ref 3.5–5.1)
Sodium: 140 mmol/L (ref 135–145)
Total Bilirubin: 0.1 mg/dL — ABNORMAL LOW (ref 0.3–1.2)
Total Protein: 6.7 g/dL (ref 6.5–8.1)

## 2021-12-28 LAB — CBC
HCT: 36.7 % (ref 36.0–46.0)
Hemoglobin: 10.7 g/dL — ABNORMAL LOW (ref 12.0–15.0)
MCH: 22.4 pg — ABNORMAL LOW (ref 26.0–34.0)
MCHC: 29.2 g/dL — ABNORMAL LOW (ref 30.0–36.0)
MCV: 76.8 fL — ABNORMAL LOW (ref 80.0–100.0)
Platelets: 362 10*3/uL (ref 150–400)
RBC: 4.78 MIL/uL (ref 3.87–5.11)
RDW: 16.2 % — ABNORMAL HIGH (ref 11.5–15.5)
WBC: 8.4 10*3/uL (ref 4.0–10.5)
nRBC: 0 % (ref 0.0–0.2)

## 2021-12-28 LAB — I-STAT BETA HCG BLOOD, ED (MC, WL, AP ONLY): I-stat hCG, quantitative: <5 m[IU]/mL (ref <5)

## 2021-12-28 NOTE — ED Notes (Signed)
This RN called EDP and notified Dr. Sabra Heck of pt complaints of heavy vaginal bleeding after fall. Dr. Sabra Heck states pt does not need CT scan at this time and to order basic labs.  ?

## 2021-12-28 NOTE — ED Triage Notes (Addendum)
Pt reported to ED after slipping and falling and landing backwards. States she landed on steps. States that she is having severe pain to lower back and new onset heaving vaginal bleeding and hematuria. States it is not menstrual bleeding as she still has normal periods. Pt also endorses dizziness. Pt also states it is painful to walk and she cannot move right leg normally.  ?

## 2021-12-29 ENCOUNTER — Emergency Department (HOSPITAL_COMMUNITY): Payer: BC Managed Care – PPO

## 2021-12-29 LAB — CBC
HCT: 37.5 % (ref 36.0–46.0)
Hemoglobin: 11.3 g/dL — ABNORMAL LOW (ref 12.0–15.0)
MCH: 22.7 pg — ABNORMAL LOW (ref 26.0–34.0)
MCHC: 30.1 g/dL (ref 30.0–36.0)
MCV: 75.5 fL — ABNORMAL LOW (ref 80.0–100.0)
Platelets: 379 10*3/uL (ref 150–400)
RBC: 4.97 MIL/uL (ref 3.87–5.11)
RDW: 16.3 % — ABNORMAL HIGH (ref 11.5–15.5)
WBC: 8.7 10*3/uL (ref 4.0–10.5)
nRBC: 0 % (ref 0.0–0.2)

## 2021-12-29 MED ORDER — HYDROCODONE-ACETAMINOPHEN 5-325 MG PO TABS: 1.0000 | ORAL_TABLET | Freq: Four times a day (QID) | ORAL | 0 refills | Status: AC | PRN

## 2021-12-29 MED ORDER — FENTANYL CITRATE PF 50 MCG/ML IJ SOSY
50.0000 ug | PREFILLED_SYRINGE | Freq: Once | INTRAMUSCULAR | Status: AC
  Administered 2021-12-29: 50 ug via INTRAVENOUS
  Filled 2021-12-29: qty 1

## 2021-12-29 MED ORDER — IOHEXOL 300 MG/ML  SOLN
100.0000 mL | Freq: Once | INTRAMUSCULAR | Status: AC | PRN
  Administered 2021-12-29: 100 mL via INTRAVENOUS

## 2021-12-29 NOTE — ED Provider Notes (Signed)
?[place] DEPT ?[facility] Emergency Department ?Provider Note ?MRN:  [id number]  ?Arrival date & time: 12/29/21    ? ?Chief Complaint   ?Fall ?  ?History of Present Illness   ?Teresa Chan is a 51 y.o. year-old female presents to the ED with chief complaint of slip and fall.  She states that she slipped on her stairs inside her home and fell backward landing on her low back.  She complains of low back pain.  Additionally, she reports vaginal bleeding and hematuria following the fall.  She states that she continues to have hematuria, and feels confident that it is coming from the urethra because she has a tampon in place.  She complains of low back pain, but denies abdominal pain.  She has been able to walk, but states that it is painful.  She has not taken anything for pain.  She denies head injury or loss of consciousness. ? ?History provided by patient. ? ? ?Review of Systems  ?Pertinent review of systems noted in HPI.  ? ? ?Physical Exam  ? ?Vitals:  ? 12/28/21 1941 12/29/21 0215  ?BP: 133/85 126/80  ?Pulse: 86   ?Resp: 16   ?Temp: 98.2 ?F (36.8 ?C)   ?SpO2: 100%   ?  ?CONSTITUTIONAL:  well-appearing, NAD ?NEURO:  Alert and oriented x 3, CN 3-12 grossly intact ?EYES:  eyes equal and reactive ?ENT/NECK:  Supple, no stridor  ?CARDIO:  normal rate, regular rhythm, appears well-perfused  ?PULM:  No respiratory distress, CTAB ?GI/GU:  non-distended, non-tender, pelvic exam notable for no hemorrhage or trauma, no blood at the urethral meatus, dark steady menstrual appearing flow from cervix, chaperone present for exam. ?MSK/SPINE:  No gross deformities, no edema, moves all extremities, lumbar spine tender to palpation, no step-off or deformity ?SKIN:  no rash, atraumatic, contusion to lumbar spine ? ? ?*Additional and/or pertinent findings included in MDM below ? ?Diagnostic and Interventional Summary  ? ? EKG Interpretation ? ?Date/Time:    ?Ventricular Rate:    ?PR Interval:    ?QRS Duration:   ?QT  Interval:    ?QTC Calculation:   ?R Axis:     ?Text Interpretation:   ?  ? ?  ? ?Labs Reviewed  ?COMPREHENSIVE METABOLIC PANEL - Abnormal; Notable for the following components:  ?    Result Value  ? Glucose, Bld 108 (*)   ? Total Bilirubin 0.1 (*)   ? All other components within normal limits  ?CBC - Abnormal; Notable for the following components:  ? Hemoglobin 10.7 (*)   ? MCV 76.8 (*)   ? MCH 22.4 (*)   ? MCHC 29.2 (*)   ? RDW 16.2 (*)   ? All other components within normal limits  ?URINALYSIS, ROUTINE W REFLEX MICROSCOPIC - Abnormal; Notable for the following components:  ? APPearance HAZY (*)   ? Hgb urine dipstick MODERATE (*)   ? Protein, ur 30 (*)   ? RBC / HPF >50 (*)   ? Bacteria, UA RARE (*)   ? All other components within normal limits  ?CBC - Abnormal; Notable for the following components:  ? Hemoglobin 11.3 (*)   ? MCV 75.5 (*)   ? MCH 22.7 (*)   ? RDW 16.3 (*)   ? All other components within normal limits  ?I-STAT BETA HCG BLOOD, ED ([facility], [facility], [facility] ONLY)  ?  ?CT ABDOMEN PELVIS W CONTRAST  ?Final Result  ?  ?CT L-SPINE NO CHARGE  ?Final Result  ?  ?  DG Hip Unilat  With Pelvis 2-3 Views Right  ?Final Result  ?  ?  ?Medications  ?fentaNYL (SUBLIMAZE) injection 50 mcg (50 mcg Intravenous Given 12/29/21 0205)  ?iohexol (OMNIPAQUE) 300 MG/ML solution 100 mL (100 mLs Intravenous Contrast Given 12/29/21 0333)  ?  ? ?Procedures  /  Critical Care ?Procedures ? ?ED Course and Medical Decision Making  ?I have reviewed the triage vital signs, the nursing notes, and pertinent available records from the EMR. ? ?Complexity of Problems Addressed: ?High Complexity: Acute illness/injury posing a threat to life or bodily function, requiring emergent diagnostic workup, evaluation, and treatment as below. ?Comorbidities affecting this illness/injury include: ?None ?Social Determinants Affecting Care: ?No clinically significant social determinants affecting this chief complaint.. ? ? ?ED Course: ?After considering the following  differential, trauma from fall, I ordered repeat hemoglobin based on complaint of vaginal bleeding and hematuria that has been persistent since the fall.  I also ordered CT imaging due to reported bleeding and hematuria. ?I personally interpreted the labs which are notable for stable hemoglobin x2 ?I visualized the lumbar x-ray which is notable for possible fracture at L4 and agree with the radiologist interpretation.. ? ?  ? ?Consultants: ?No consultations were needed in caring for this patient. ? ?Treatment and Plan: ?Patient here after mechanical fall.  She reports having vaginal bleeding and hematuria.  On physical exam, she appears to have normal menstrual flow.  I suspect that her hematuria is actually from her menstrual cycle and not from trauma.  CT scan was ordered to ensure no traumatic injury to the ureters, kidney, or bladder.  CT was negative for intra-abdominal injury, but does note transverse process fractures to L3 and L4.  Patient was given a lumbar corset for comfort and advised to follow-up with neurosurgery.  I discussed her CT results and incidental findings and have suggested that she follow-up with her primary care doctor regarding these findings. ? ?Emergency department workup does not suggest an emergent condition requiring admission or immediate intervention beyond  what has been performed at this time. The patient is safe for discharge and has  been instructed to return immediately for worsening symptoms, change in  symptoms or any other concerns ? ? ? ?Final Clinical Impressions(s) / ED Diagnoses  ? ?  ICD-10-CM   ?1. Lumbar transverse process fracture, closed, initial encounter ([place])  S32.009A   ?  ?2. Cyst of left kidney  N28.1   ?  ?3. Uterine leiomyoma, unspecified location  D25.9   ?  ?  ?ED Discharge Orders   ? ?      Ordered  ?  HYDROcodone-acetaminophen (NORCO/VICODIN) 5-325 MG tablet  Every 6 hours PRN       ? 12/29/21 0442  ? ?  ?  ? ?  ?  ? ? ?Discharge Instructions Discussed  with and Provided to Patient:  ? ? ? ?Discharge Instructions   ? ?  ?Your CT scan showed transverse process fractures of your L3 and L4 vertabrae.  These are generally very stable and should heal fine without surgery.  I recommend that you contact the spine specialist listed to ensure proper healing.  You can wear the lumbar back brace for comfort.  Take pain medication as needed and as directed. ? ?You had incidental findings of uterine fibroids and a left kidney cyst.  These are not thought contributory to your symptoms today.  You should discuss these findings with your PCP. ? ?Return for new or worsening symptoms. ? ? ? ? ?  ?  Montine Circle, PA-C ?12/29/21 6893 ? ?  ?Truddie Hidden, MD ?12/29/21 [phone] ? ?

## 2021-12-29 NOTE — Discharge Instructions (Addendum)
Your CT scan showed transverse process fractures of your L3 and L4 vertabrae.  These are generally very stable and should heal fine without surgery.  I recommend that you contact the spine specialist listed to ensure proper healing.  You can wear the lumbar back brace for comfort.  Take pain medication as needed and as directed. ? ?You had incidental findings of uterine fibroids and a left kidney cyst.  These are not thought contributory to your symptoms today.  You should discuss these findings with your PCP. ? ?Return for new or worsening symptoms. ?

## 2021-12-29 NOTE — ED Notes (Signed)
Patient to ER room 26. Patient reports falling down on coccyx after slipping at home. Patient reports lower back pain and vaginal bleeding/bleeding with urination that was not previously present prior to fall. Patient is alert and oriented and calm. Call bell in reach. ?

## 2022-01-16 ENCOUNTER — Ambulatory Visit: Payer: BC Managed Care – PPO | Admitting: Family

## 2022-01-16 ENCOUNTER — Encounter: Payer: Self-pay | Admitting: Family

## 2022-01-16 VITALS — BP 119/72 | HR 72 | Temp 98.3°F | Ht 64.0 in | Wt 180.0 lb

## 2022-01-16 DIAGNOSIS — J029 Acute pharyngitis, unspecified: Secondary | ICD-10-CM

## 2022-01-16 LAB — POCT RAPID STREP A (OFFICE): Rapid Strep A Screen: NEGATIVE

## 2022-01-16 MED ORDER — AMOXICILLIN 500 MG PO CAPS: 500.0000 mg | ORAL_CAPSULE | Freq: Two times a day (BID) | ORAL | 0 refills | Status: AC

## 2022-01-16 NOTE — Progress Notes (Signed)
Subjective:     Patient ID: Teresa Chan, female    DOB: Aug 10, 1971, 51 y.o.   MRN: [id number]  Chief Complaint  Patient presents with   Sore Throat    Pt c/o sore throat since Saturday, and tried amoxicillin she had at home which did help. Needs refill.    HPI: Sore Throat: Patient complains of sore throat. Associated symptoms include sore throat.Onset of symptoms was 5 days ago, gradually improving since that time. She is drinking moderate amounts of fluids. She has not had recent close exposure to someone with proven streptococcal pharyngitis. Pt had old AMOX at home and started taking it 2 days ago. Reports she gets Strep about every year even as an adult, works as a Pharmacist, hospital.   Assessment & Plan:   Problem List Items Addressed This Visit   None Visit Diagnoses     Sore throat    -  Primary   pt reports starting AMOX 2 days ago, could not get in for testing, her sx were in improving, going out of town this weekend for father's funeral and needed more med. Advised in future always best to test first to be sure, test today is neg as expected. Will send 5 more days of med, if pt has any sx after finishing, should come get tested.  Relevant Orders   POCT rapid strep A      Outpatient Medications Prior to Visit  Medication Sig Dispense Refill   HYDROcodone-acetaminophen (NORCO/VICODIN) 5-325 MG tablet Take 1-2 tablets by mouth every 6 (six) hours as needed. 10 tablet 0   OVER THE COUNTER MEDICATION Take 1 tablet by mouth daily as needed (low iron).     No facility-administered medications prior to visit.    Past Medical History:  Diagnosis Date   Anxiety    Encounter for routine gynecological examination    Dr. Corinna Capra   History of chicken pox    History of frequent urinary tract infections    History of UTI     Past Surgical History:  Procedure Laterality Date   CESAREAN SECTION  2005, 2007   x2    No Known Allergies     Objective:    Physical Exam Vitals  and nursing note reviewed.  Constitutional:      Appearance: Normal appearance.  HENT:     Right Ear: Tympanic membrane and ear canal normal.     Left Ear: Tympanic membrane and ear canal normal.     Mouth/Throat:     Mouth: Mucous membranes are moist.     Pharynx: Posterior oropharyngeal erythema (mild) present. No pharyngeal swelling, oropharyngeal exudate or uvula swelling.  Cardiovascular:     Rate and Rhythm: Normal rate and regular rhythm.  Pulmonary:     Effort: Pulmonary effort is normal.     Breath sounds: Normal breath sounds.  Musculoskeletal:        General: Normal range of motion.  Skin:    General: Skin is warm and dry.  Neurological:     Mental Status: She is alert.  Psychiatric:        Mood and Affect: Mood normal.        Behavior: Behavior normal.    BP 119/72 (BP Location: Left Arm, Patient Position: Sitting, Cuff Size: Large)   Pulse 72   Temp 98.3 F (36.8 C) (Temporal)   Ht '5\' 4"'$  (1.626 m)   Wt 180 lb (81.6 kg)   LMP 01/02/2022 (Approximate)   SpO2 98%  BMI 30.90 kg/m  Wt Readings from Last 3 Encounters:  01/16/22 180 lb (81.6 kg)  10/31/21 186 lb (84.4 kg)  10/30/21 184 lb 6.4 oz (83.6 kg)      Jeanie Sewer, NP

## 2022-05-26 ENCOUNTER — Encounter: Payer: Self-pay | Admitting: *Deleted

## 2022-08-14 ENCOUNTER — Encounter: Payer: Self-pay | Admitting: *Deleted

## 2023-09-16 ENCOUNTER — Other Ambulatory Visit: Payer: Self-pay | Admitting: Obstetrics and Gynecology

## 2023-09-16 DIAGNOSIS — R928 Other abnormal and inconclusive findings on diagnostic imaging of breast: Secondary | ICD-10-CM

## 2023-09-24 ENCOUNTER — Ambulatory Visit
Admission: RE | Admit: 2023-09-24 | Discharge: 2023-09-24 | Disposition: A | Discharge status: Home / Self Care | Payer: Self-pay | Source: Ambulatory Visit | Attending: Obstetrics and Gynecology | Admitting: Obstetrics and Gynecology

## 2023-09-24 ENCOUNTER — Ambulatory Visit
Admission: RE | Admit: 2023-09-24 | Discharge: 2023-09-24 | Disposition: A | Discharge status: Home / Self Care | Payer: 59 | Source: Ambulatory Visit | Attending: Obstetrics and Gynecology | Admitting: Obstetrics and Gynecology

## 2023-09-24 DIAGNOSIS — R928 Other abnormal and inconclusive findings on diagnostic imaging of breast: Secondary | ICD-10-CM

## 2023-12-02 ENCOUNTER — Other Ambulatory Visit: Payer: Self-pay | Admitting: Pulmonary Disease

## 2023-12-02 ENCOUNTER — Ambulatory Visit: Payer: Self-pay | Admitting: Pulmonary Disease

## 2023-12-02 ENCOUNTER — Telehealth: Payer: Self-pay | Admitting: Pharmacy Technician

## 2023-12-02 DIAGNOSIS — D649 Anemia, unspecified: Secondary | ICD-10-CM | POA: Insufficient documentation

## 2023-12-02 NOTE — Telephone Encounter (Signed)
 Auth Submission: NO AUTH NEEDED Site of care: Site of care: [facility] Payer: [employer] [place] Medication & CPT/J Code(s) submitted: Venofer (Iron Sucrose) J1756 Route of submission (phone, fax, portal):  Phone # Fax # Auth type: Buy/Bill PB Units/visits requested: X1 DOSE Reference number:  Approval from: 12/02/23 to 04/02/24

## 2023-12-03 ENCOUNTER — Ambulatory Visit (INDEPENDENT_AMBULATORY_CARE_PROVIDER_SITE_OTHER)

## 2023-12-03 VITALS — BP 110/73 | HR 75 | Temp 98.0°F | Resp 16 | Ht 64.0 in | Wt 192.0 lb

## 2023-12-03 DIAGNOSIS — D509 Iron deficiency anemia, unspecified: Secondary | ICD-10-CM

## 2023-12-03 MED ORDER — SODIUM CHLORIDE 0.9 % IV SOLN
300.0000 mg | Freq: Once | INTRAVENOUS | Status: AC
  Administered 2023-12-03: 300 mg via INTRAVENOUS
  Filled 2023-12-03: qty 15

## 2023-12-03 NOTE — Progress Notes (Cosign Needed)
 Diagnosis: Iron Deficiency Anemia  Provider:  Chilton Greathouse MD  Procedure: IV Infusion  IV Type: Peripheral, IV Location: L Forearm  Venofer (Iron Sucrose), Dose: 300 mg  Infusion Start Time: 1111  Infusion Stop Time: 1250  Post Infusion IV Care: Observation period completed and Peripheral IV Discontinued  Discharge: Condition: Good, Destination: Home . AVS Provided  Performed by:  Loney Hering, LPN

## 2023-12-09 ENCOUNTER — Other Ambulatory Visit: Payer: Self-pay | Admitting: Obstetrics and Gynecology

## 2023-12-16 LAB — SURGICAL PATHOLOGY
